# Patient Record
Sex: Female | Born: 1960
Health system: Southern US, Community
[De-identification: ages and names within clinical notes are randomized; demographics above are authoritative.]

## PROBLEM LIST (undated history)

## (undated) DIAGNOSIS — F419 Anxiety disorder, unspecified: Secondary | ICD-10-CM

## (undated) DIAGNOSIS — I2109 ST elevation (STEMI) myocardial infarction involving other coronary artery of anterior wall: Principal | ICD-10-CM

## (undated) DIAGNOSIS — F17200 Nicotine dependence, unspecified, uncomplicated: Secondary | ICD-10-CM

## (undated) DIAGNOSIS — E782 Mixed hyperlipidemia: Secondary | ICD-10-CM

## (undated) DIAGNOSIS — I255 Ischemic cardiomyopathy: Secondary | ICD-10-CM

## (undated) DIAGNOSIS — M1990 Unspecified osteoarthritis, unspecified site: Secondary | ICD-10-CM

---

## 2000-08-21 ENCOUNTER — Encounter (INDEPENDENT_AMBULATORY_CARE_PROVIDER_SITE_OTHER): Payer: Self-pay | Admitting: *Deleted

## 2000-08-21 LAB — CONVERTED CEMR LAB

## 2000-09-11 ENCOUNTER — Encounter: Admission: RE | Admit: 2000-09-11 | Discharge: 2000-09-11 | Payer: Self-pay | Admitting: Family Medicine

## 2000-10-17 ENCOUNTER — Encounter: Admission: RE | Admit: 2000-10-17 | Discharge: 2000-10-17 | Payer: Self-pay | Admitting: Family Medicine

## 2001-03-22 ENCOUNTER — Encounter: Payer: Self-pay | Admitting: Emergency Medicine

## 2001-03-22 ENCOUNTER — Emergency Department (HOSPITAL_COMMUNITY): Admission: EM | Admit: 2001-03-22 | Discharge: 2001-03-22 | Payer: Self-pay | Admitting: Emergency Medicine

## 2001-03-28 ENCOUNTER — Encounter: Admission: RE | Admit: 2001-03-28 | Discharge: 2001-03-28 | Payer: Self-pay | Admitting: Family Medicine

## 2001-04-18 ENCOUNTER — Encounter: Admission: RE | Admit: 2001-04-18 | Discharge: 2001-04-18 | Payer: Self-pay | Admitting: Family Medicine

## 2006-07-30 ENCOUNTER — Emergency Department (HOSPITAL_COMMUNITY): Admission: EM | Admit: 2006-07-30 | Discharge: 2006-07-30 | Payer: Self-pay | Admitting: Emergency Medicine

## 2007-01-19 ENCOUNTER — Encounter (INDEPENDENT_AMBULATORY_CARE_PROVIDER_SITE_OTHER): Payer: Self-pay | Admitting: *Deleted

## 2007-05-10 ENCOUNTER — Emergency Department (HOSPITAL_COMMUNITY): Admission: EM | Admit: 2007-05-10 | Discharge: 2007-05-10 | Payer: Self-pay | Admitting: Emergency Medicine

## 2007-05-15 ENCOUNTER — Ambulatory Visit (HOSPITAL_COMMUNITY): Admission: RE | Admit: 2007-05-15 | Discharge: 2007-05-15 | Payer: Self-pay | Admitting: Emergency Medicine

## 2010-02-09 ENCOUNTER — Emergency Department (HOSPITAL_COMMUNITY): Admission: EM | Admit: 2010-02-09 | Discharge: 2010-02-09 | Payer: Self-pay | Admitting: Emergency Medicine

## 2010-10-03 ENCOUNTER — Emergency Department (HOSPITAL_COMMUNITY): Admission: EM | Admit: 2010-10-03 | Discharge: 2010-10-03 | Payer: Self-pay | Admitting: Emergency Medicine

## 2010-10-21 ENCOUNTER — Emergency Department (HOSPITAL_COMMUNITY)
Admission: EM | Admit: 2010-10-21 | Discharge: 2010-10-21 | Payer: Self-pay | Source: Home / Self Care | Admitting: Emergency Medicine

## 2010-11-27 ENCOUNTER — Emergency Department (HOSPITAL_COMMUNITY)
Admission: EM | Admit: 2010-11-27 | Discharge: 2010-11-27 | Payer: Self-pay | Source: Home / Self Care | Admitting: Emergency Medicine

## 2010-12-06 LAB — RAPID STREP SCREEN (MED CTR MEBANE ONLY): Streptococcus, Group A Screen (Direct): POSITIVE — AB

## 2011-03-15 ENCOUNTER — Ambulatory Visit (INDEPENDENT_AMBULATORY_CARE_PROVIDER_SITE_OTHER): Payer: Self-pay

## 2011-03-15 ENCOUNTER — Inpatient Hospital Stay (INDEPENDENT_AMBULATORY_CARE_PROVIDER_SITE_OTHER)
Admission: RE | Admit: 2011-03-15 | Discharge: 2011-03-15 | Disposition: A | Discharge status: Home / Self Care | Payer: Self-pay | Source: Ambulatory Visit | Attending: Emergency Medicine | Admitting: Emergency Medicine

## 2011-03-15 DIAGNOSIS — M25469 Effusion, unspecified knee: Secondary | ICD-10-CM

## 2011-03-15 LAB — URIC ACID: Uric Acid, Serum: 3.8 mg/dL (ref 2.4–7.0)

## 2011-03-19 ENCOUNTER — Inpatient Hospital Stay (INDEPENDENT_AMBULATORY_CARE_PROVIDER_SITE_OTHER)
Admission: RE | Admit: 2011-03-19 | Discharge: 2011-03-19 | Disposition: A | Discharge status: Home / Self Care | Payer: Self-pay | Source: Ambulatory Visit | Attending: Emergency Medicine | Admitting: Emergency Medicine

## 2011-03-19 DIAGNOSIS — M25469 Effusion, unspecified knee: Secondary | ICD-10-CM

## 2011-04-05 NOTE — Procedures (Signed)
PRIMARY CARE PHYSICIAN:  Dr. Lynelle Doctor.   CLINICAL INFORMATION:  This patient is a 50 year old white female with a  single episode of seizure activity last Thursday, currently on etodolac,  gabapentin, sertraline, tramadol and trazodone.   TECHNICAL DESCRIPTION:  This EEG was recorded entirely during the awake  state.  The background activity shows high frequency alpha and low-  voltage fast beta activity symmetrically present without much effect on  the background activity with eye opening and eye closing maneuvers.  Photic stimulation was performed which produced a driving response in  the occipital head regions.  Hyperventilation testing was performed  without any abnormalities seen.  There was no evidence of any focal  asymmetry, stage II sleep or epileptiform activity present on this EEG.   IMPRESSION:  This is a normal EEG.  There is much low-voltage fast beta  activity suggestive of medication effect.  Should the possibility of  seizure activity be sought, then consideration of a sleep-deprived EEG  may be in order to look for evidence of epileptiform activity.           ______________________________  Genene Churn. Sandria Manly, M.D.     [facility]  D:  05/15/2007 15:28:47  T:  05/16/2007 08:47:20  Job #:  [id number]

## 2011-09-07 LAB — CBC
HCT: 32.9 — ABNORMAL LOW
Hemoglobin: 10.8 — ABNORMAL LOW
MCHC: 32.9
MCV: 77.9 — ABNORMAL LOW
Platelets: 229
RBC: 4.22
RDW: 15 — ABNORMAL HIGH
WBC: 6.4

## 2011-09-07 LAB — COMPREHENSIVE METABOLIC PANEL
ALT: 14
AST: 19
Albumin: 4.1
Alkaline Phosphatase: 158 — ABNORMAL HIGH
BUN: 5 — ABNORMAL LOW
CO2: 27
Calcium: 9.1
Chloride: 102
Creatinine, Ser: 0.72
GFR calc Af Amer: >60
GFR calc non Af Amer: >60
Glucose, Bld: 101 — ABNORMAL HIGH
Potassium: 3.4 — ABNORMAL LOW
Sodium: 137
Total Bilirubin: 0.7
Total Protein: 7.3

## 2011-09-07 LAB — DIFFERENTIAL
Basophils Absolute: 0
Basophils Relative: 1
Eosinophils Absolute: 0.1
Eosinophils Relative: 1
Lymphocytes Relative: 26
Lymphs Abs: 1.6
Monocytes Absolute: 0.5
Monocytes Relative: 8
Neutro Abs: 4.1
Neutrophils Relative %: 65

## 2011-09-07 LAB — RAPID URINE DRUG SCREEN, HOSP PERFORMED
Amphetamines: NOT DETECTED
Barbiturates: NOT DETECTED
Benzodiazepines: NOT DETECTED
Cocaine: NOT DETECTED
Opiates: NOT DETECTED
Tetrahydrocannabinol: NOT DETECTED

## 2011-09-07 LAB — MAGNESIUM: Magnesium: 1.8

## 2011-09-07 LAB — ACETAMINOPHEN LEVEL: Acetaminophen (Tylenol), Serum: <10 — ABNORMAL LOW

## 2011-09-07 LAB — ETHANOL: Alcohol, Ethyl (B): <5

## 2012-08-05 ENCOUNTER — Encounter (HOSPITAL_COMMUNITY): Payer: Self-pay | Admitting: Emergency Medicine

## 2012-08-05 ENCOUNTER — Emergency Department (INDEPENDENT_AMBULATORY_CARE_PROVIDER_SITE_OTHER)
Admission: EM | Admit: 2012-08-05 | Discharge: 2012-08-05 | Disposition: A | Discharge status: Home / Self Care | Payer: Self-pay | Source: Home / Self Care | Attending: Emergency Medicine | Admitting: Emergency Medicine

## 2012-08-05 DIAGNOSIS — M25569 Pain in unspecified knee: Secondary | ICD-10-CM

## 2012-08-05 MED ORDER — TRAMADOL HCL 50 MG PO TABS: 50.0000 mg | ORAL_TABLET | Freq: Four times a day (QID) | ORAL | Status: AC | PRN

## 2012-08-05 MED ORDER — KETOROLAC TROMETHAMINE 60 MG/2ML IM SOLN
INTRAMUSCULAR | Status: AC
  Filled 2012-08-05: qty 2

## 2012-08-05 MED ORDER — KETOROLAC TROMETHAMINE 60 MG/2ML IM SOLN
60.0000 mg | Freq: Once | INTRAMUSCULAR | Status: AC
  Administered 2012-08-05: 60 mg via INTRAMUSCULAR

## 2012-08-05 MED ORDER — MELOXICAM 7.5 MG PO TABS: 7.5000 mg | ORAL_TABLET | Freq: Every day | ORAL | Status: AC

## 2012-08-05 NOTE — ED Provider Notes (Signed)
Medical screening examination/treatment/procedure(s) were performed by non-physician practitioner and as supervising physician I was immediately available for consultation/collaboration.  Kamry Faraci   Neela Zecca, MD 08/05/12 1412 

## 2012-08-05 NOTE — ED Notes (Signed)
Pt c/o bilateral knee pain x several wks most pain is at night time, ? Some swelling. Pt has tried otc meds for pain with no relief.

## 2012-08-05 NOTE — ED Provider Notes (Signed)
History     CSN: [id number]  Arrival date & time 08/05/12  1204   None     Chief Complaint  Patient presents with  . Knee Pain    bilateral knee pain    (Consider location/radiation/quality/duration/timing/severity/associated sxs/prior treatment) The history is provided by the patient.  Complains of right sharp intermittent knee pain, known history of arthritis.  No known injury.  Has been taking OTC pain relievers with minimal relief.  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  Pain is worse at night, no limping.    History reviewed. No pertinent past medical history.  History reviewed. No pertinent past surgical history.  Family History  Problem Relation Age of Onset  . Cancer Other     History  Substance Use Topics  . Smoking status: Current Every Day Smoker -- 1.0 packs/day    Types: Cigarettes  . Smokeless tobacco: Not on file  . Alcohol Use: No    OB History    Grav Para Term Preterm Abortions TAB SAB Ect Mult Living                  Review of Systems  Constitutional: Negative.   Respiratory: Negative.   Cardiovascular: Negative.   Musculoskeletal: Positive for arthralgias.    Allergies  Review of patient's allergies indicates no known allergies.  Home Medications   Current Outpatient Rx  Name Route Sig Dispense Refill  . NEURONTIN PO Oral Take by mouth.    Marland Kitchen ZOLOFT PO Oral Take by mouth.    . MELOXICAM 7.5 MG PO TABS Oral Take 1 tablet (7.5 mg total) by mouth daily. 30 tablet 2  . TRAMADOL HCL 50 MG PO TABS Oral Take 1 tablet (50 mg total) by mouth every 6 (six) hours as needed for pain. 30 tablet 1    BP 118/63  Pulse 70  Temp 98.1 F (36.7 C) (Oral)  Resp 18  SpO2 99%  Physical Exam  Nursing note and vitals reviewed. Constitutional: She is oriented to person, place, and time. Vital signs are normal. She appears well-developed and well-nourished. She is active and cooperative.  HENT:  Head:  Normocephalic.  Eyes: Conjunctivae normal are normal. Pupils are equal, round, and reactive to light. No scleral icterus.  Neck: Trachea normal. Neck supple.  Cardiovascular: Normal rate, regular rhythm and normal heart sounds.   Pulmonary/Chest: Effort normal and breath sounds normal.  Musculoskeletal:       Right knee: Normal.       Left knee: Normal.       Crepitus palpated in right knee, no erythema or obvious swelling.  Neurological: She is alert and oriented to person, place, and time. No cranial nerve deficit or sensory deficit.  Skin: Skin is warm and dry.  Psychiatric: She has a normal mood and affect. Her speech is normal and behavior is normal. Judgment and thought content normal. Cognition and memory are normal.    ED Course  Procedures (including critical care time)  Labs Reviewed - No data to display No results found.   1. Knee pain       MDM  Mobic daily.  Ultram for pain that is worse at nigh.  Knee sleeve provided.  Follow up with ortho for further evaluation and intervention.          Johnsie Kindred, NP 08/05/12 1334

## 2013-05-22 ENCOUNTER — Encounter (HOSPITAL_COMMUNITY): Payer: Self-pay

## 2013-05-22 ENCOUNTER — Emergency Department (HOSPITAL_COMMUNITY)
Admission: EM | Admit: 2013-05-22 | Discharge: 2013-05-22 | Disposition: A | Discharge status: Home / Self Care | Payer: Self-pay | Attending: Emergency Medicine | Admitting: Emergency Medicine

## 2013-05-22 DIAGNOSIS — K029 Dental caries, unspecified: Secondary | ICD-10-CM | POA: Insufficient documentation

## 2013-05-22 DIAGNOSIS — K047 Periapical abscess without sinus: Secondary | ICD-10-CM | POA: Insufficient documentation

## 2013-05-22 DIAGNOSIS — Z79899 Other long term (current) drug therapy: Secondary | ICD-10-CM | POA: Insufficient documentation

## 2013-05-22 DIAGNOSIS — F17200 Nicotine dependence, unspecified, uncomplicated: Secondary | ICD-10-CM | POA: Insufficient documentation

## 2013-05-22 DIAGNOSIS — R220 Localized swelling, mass and lump, head: Secondary | ICD-10-CM | POA: Insufficient documentation

## 2013-05-22 MED ORDER — HYDROCODONE-ACETAMINOPHEN 5-325 MG PO TABS: 1.0000 | ORAL_TABLET | ORAL | Status: DC | PRN

## 2013-05-22 MED ORDER — CLINDAMYCIN HCL 150 MG PO CAPS: 150.0000 mg | ORAL_CAPSULE | Freq: Four times a day (QID) | ORAL | Status: DC

## 2013-05-22 NOTE — ED Provider Notes (Signed)
Medical screening examination/treatment/procedure(s) were performed by non-physician practitioner and as supervising physician I was immediately available for consultation/collaboration.  Ethelda Chick, MD 05/22/13 1023

## 2013-05-22 NOTE — Progress Notes (Signed)
[facility] has seen patient and provided her with a list of primary care resources, as well as, a oc application. ° °

## 2013-05-22 NOTE — ED Notes (Signed)
Pt c/o rt lower toothache with facial swelling x5days

## 2013-05-22 NOTE — ED Provider Notes (Signed)
   History    CSN: [id number] Arrival date & time 05/22/13  4098  First MD Initiated Contact with Patient 05/22/13 1011     Chief Complaint  Patient presents with  . Dental Pain   (Consider location/radiation/quality/duration/timing/severity/associated sxs/prior Treatment) HPI  52 year old female presents with dental pain.  Report gradual onset of throbbing pain to R lower tooth with associate facial swelling x 5 days.  Pain is non radiating, 8/10, worsening with chewing and minimally improved with OTC pain med.  No fever, headache, neck pain, sore throat, rash, or recent trauma.  Does not have a dentist.  Not allergic to any medication.   History reviewed. No pertinent past medical history. History reviewed. No pertinent past surgical history. Family History  Problem Relation Age of Onset  . Cancer Other    History  Substance Use Topics  . Smoking status: Current Every Day Smoker -- 1.00 packs/day    Types: Cigarettes  . Smokeless tobacco: Not on file  . Alcohol Use: No   OB History   Grav Para Term Preterm Abortions TAB SAB Ect Mult Living                 Review of Systems  Constitutional: Negative for fever.  HENT: Positive for dental problem.   Skin: Negative for rash.  Neurological: Negative for headaches.     Allergies  Review of patient's allergies indicates no known allergies.  Home Medications   Current Outpatient Rx  Name  Route  Sig  Dispense  Refill  . Gabapentin (NEURONTIN PO)   Oral   Take by mouth.         . meloxicam (MOBIC) 7.5 MG tablet   Oral   Take 1 tablet (7.5 mg total) by mouth daily.   30 tablet   2   . Sertraline HCl (ZOLOFT PO)   Oral   Take by mouth.          BP 120/74  Pulse 68  Temp(Src) 98.6 F (37 C) (Oral)  Resp 15  SpO2 96% Physical Exam  Nursing note and vitals reviewed. Constitutional: She appears well-developed and well-nourished.  HENT:  Head: Atraumatic.  Tenderness and associate gum swelling to R lower  canine with significant dental decay.  Facial swelling noted.  No trismus.  Small abscess not amenable for drainage at this time.    Eyes: Conjunctivae are normal.  Neck: Neck supple.  Musculoskeletal: Normal range of motion.  Lymphadenopathy:    She has no cervical adenopathy.  Neurological: She is alert.  Skin: Skin is warm. No rash noted.  Psychiatric: She has a normal mood and affect.    ED Course  Procedures (including critical care time)  10:17 AM Dental pain associate with periapical abscess and facial involvement.  Will d/c with pain meds and abx.     Labs Reviewed - No data to display No results found. 1. Periapical abscess with facial involvement     MDM  BP 120/74  Pulse 68  Temp(Src) 98.6 F (37 C) (Oral)  Resp 15  SpO2 96%   Fayrene Helper, PA-C 05/22/13 1021

## 2013-09-04 ENCOUNTER — Emergency Department (HOSPITAL_COMMUNITY): Payer: No Typology Code available for payment source

## 2013-09-04 ENCOUNTER — Inpatient Hospital Stay (HOSPITAL_COMMUNITY)
Admission: EM | Admit: 2013-09-04 | Discharge: 2013-09-06 | DRG: 282 | Disposition: A | Discharge status: Home / Self Care | Payer: Self-pay | Attending: Interventional Cardiology | Admitting: Interventional Cardiology

## 2013-09-04 ENCOUNTER — Encounter (HOSPITAL_COMMUNITY): Payer: Self-pay | Admitting: Emergency Medicine

## 2013-09-04 DIAGNOSIS — I255 Ischemic cardiomyopathy: Secondary | ICD-10-CM

## 2013-09-04 DIAGNOSIS — Z79899 Other long term (current) drug therapy: Secondary | ICD-10-CM

## 2013-09-04 DIAGNOSIS — I213 ST elevation (STEMI) myocardial infarction of unspecified site: Secondary | ICD-10-CM

## 2013-09-04 DIAGNOSIS — I519 Heart disease, unspecified: Secondary | ICD-10-CM | POA: Diagnosis present

## 2013-09-04 DIAGNOSIS — E782 Mixed hyperlipidemia: Secondary | ICD-10-CM

## 2013-09-04 DIAGNOSIS — F17200 Nicotine dependence, unspecified, uncomplicated: Secondary | ICD-10-CM | POA: Diagnosis present

## 2013-09-04 DIAGNOSIS — I2109 ST elevation (STEMI) myocardial infarction involving other coronary artery of anterior wall: Principal | ICD-10-CM | POA: Diagnosis present

## 2013-09-04 DIAGNOSIS — M129 Arthropathy, unspecified: Secondary | ICD-10-CM | POA: Diagnosis present

## 2013-09-04 DIAGNOSIS — F411 Generalized anxiety disorder: Secondary | ICD-10-CM | POA: Diagnosis present

## 2013-09-04 DIAGNOSIS — Z7982 Long term (current) use of aspirin: Secondary | ICD-10-CM

## 2013-09-04 DIAGNOSIS — E785 Hyperlipidemia, unspecified: Secondary | ICD-10-CM | POA: Diagnosis present

## 2013-09-04 HISTORY — DX: Unspecified osteoarthritis, unspecified site: M19.90

## 2013-09-04 HISTORY — DX: Ischemic cardiomyopathy: I25.5

## 2013-09-04 HISTORY — DX: Anxiety disorder, unspecified: F41.9

## 2013-09-04 HISTORY — DX: ST elevation (STEMI) myocardial infarction involving other coronary artery of anterior wall: I21.09

## 2013-09-04 HISTORY — DX: Mixed hyperlipidemia: E78.2

## 2013-09-04 HISTORY — DX: Nicotine dependence, unspecified, uncomplicated: F17.200

## 2013-09-04 LAB — POCT I-STAT TROPONIN I: Troponin i, poc: 0.62 ng/mL (ref 0.00–0.08)

## 2013-09-04 LAB — CBC
HCT: 39.4 % (ref 36.0–46.0)
Hemoglobin: 14.2 g/dL (ref 12.0–15.0)
MCH: 31.7 pg (ref 26.0–34.0)
MCHC: 36 g/dL (ref 30.0–36.0)
MCV: 87.9 fL (ref 78.0–100.0)
Platelets: 139 10*3/uL — ABNORMAL LOW (ref 150–400)
RBC: 4.48 MIL/uL (ref 3.87–5.11)
RDW: 13.2 % (ref 11.5–15.5)
WBC: 6.1 10*3/uL (ref 4.0–10.5)

## 2013-09-04 MED ORDER — FENTANYL CITRATE 0.05 MG/ML IJ SOLN
INTRAMUSCULAR | Status: AC
  Administered 2013-09-04: 100 ug
  Filled 2013-09-04: qty 2

## 2013-09-04 MED ORDER — ASPIRIN 81 MG PO CHEW
324.0000 mg | CHEWABLE_TABLET | Freq: Once | ORAL | Status: AC
  Administered 2013-09-04: 324 mg via ORAL
  Filled 2013-09-04: qty 4

## 2013-09-04 MED ORDER — NITROGLYCERIN IN D5W 200-5 MCG/ML-% IV SOLN
2.0000 ug/min | Freq: Once | INTRAVENOUS | Status: AC
  Administered 2013-09-04: 10 ug/min via INTRAVENOUS
  Filled 2013-09-04: qty 250

## 2013-09-04 MED ORDER — HEPARIN SODIUM (PORCINE) 5000 UNIT/ML IJ SOLN
60.0000 [IU]/kg | Freq: Once | INTRAMUSCULAR | Status: AC
  Administered 2013-09-04: 3950 [IU] via INTRAVENOUS
  Filled 2013-09-04: qty 0.79

## 2013-09-04 MED ORDER — HEPARIN (PORCINE) IN NACL 100-0.45 UNIT/ML-% IJ SOLN
12.0000 [IU]/kg/h | INTRAMUSCULAR | Status: DC
  Administered 2013-09-04: 12 [IU]/kg/h via INTRAVENOUS
  Filled 2013-09-04: qty 250

## 2013-09-04 NOTE — ED Notes (Signed)
Pt complains of a pressure type left sided cheat pain on and off all day, no other symptoms

## 2013-09-04 NOTE — ED Provider Notes (Signed)
CSN: [id number]     Arrival date & time 09/04/13  2314 History   First MD Initiated Contact with Patient 09/04/13 2326     Chief Complaint  Patient presents with  . Chest Pain   (Consider location/radiation/quality/duration/timing/severity/associated sxs/prior Treatment) Patient is a 52 y.o. female presenting with chest pain. The history is provided by the patient.  Chest Pain  patient complains of substernal chest pain which began today and has been episodic lasting from 5-20 minutes with associated dyspnea diaphoresis. No prior history of same. Denies any history of CAD. Patient has been more continuous which is why she presents now in his characterizes 5 of 10. Denies any recent fever or chills. No cough or congestion. Denies any syncope or near-syncope. No treatment used prior to arrival and symptoms do seem to be exertional.  Past Medical History  Diagnosis Date  . Anxiety    History reviewed. No pertinent past surgical history. Family History  Problem Relation Age of Onset  . Cancer Other    History  Substance Use Topics  . Smoking status: Current Every Day Smoker -- 1.00 packs/day    Types: Cigarettes  . Smokeless tobacco: Not on file  . Alcohol Use: No   OB History   Grav Para Term Preterm Abortions TAB SAB Ect Mult Living                 Review of Systems  Cardiovascular: Positive for chest pain.  All other systems reviewed and are negative.    Allergies  Review of patient's allergies indicates no known allergies.  Home Medications   Current Outpatient Rx  Name  Route  Sig  Dispense  Refill  . clindamycin (CLEOCIN) 150 MG capsule   Oral   Take 1 capsule (150 mg total) by mouth every 6 (six) hours.   28 capsule   0   . Gabapentin (NEURONTIN PO)   Oral   Take by mouth.         Marland Kitchen HYDROcodone-acetaminophen (NORCO/VICODIN) 5-325 MG per tablet   Oral   Take 1 tablet by mouth every 4 (four) hours as needed for pain.   10 tablet   0   . Sertraline HCl  (ZOLOFT PO)   Oral   Take by mouth.          BP 150/89  Pulse 64  Temp(Src) 98.4 F (36.9 C) (Oral)  Resp 20  Ht 5\' 4"  (1.626 m)  Wt 145 lb (65.772 kg)  BMI 24.88 kg/m2  SpO2 98% Physical Exam  Nursing note and vitals reviewed. Constitutional: She is oriented to person, place, and time. She appears well-developed and well-nourished.  Non-toxic appearance. No distress.  HENT:  Head: Normocephalic and atraumatic.  Eyes: Conjunctivae, EOM and lids are normal. Pupils are equal, round, and reactive to light.  Neck: Normal range of motion. Neck supple. No tracheal deviation present. No mass present.  Cardiovascular: Normal rate, regular rhythm and normal heart sounds.  Exam reveals no gallop.   No murmur heard. Pulmonary/Chest: Effort normal and breath sounds normal. No stridor. No respiratory distress. She has no decreased breath sounds. She has no wheezes. She has no rhonchi. She has no rales.  Abdominal: Soft. Normal appearance and bowel sounds are normal. She exhibits no distension. There is no tenderness. There is no rebound and no CVA tenderness.  Musculoskeletal: Normal range of motion. She exhibits no edema and no tenderness.  Neurological: She is alert and oriented to person, place, and time. She  has normal strength. No cranial nerve deficit or sensory deficit. GCS eye subscore is 4. GCS verbal subscore is 5. GCS motor subscore is 6.  Skin: Skin is warm and dry. No abrasion and no rash noted.  Psychiatric: She has a normal mood and affect. Her speech is normal and behavior is normal.    ED Course  Procedures (including critical care time) Labs Review Labs Reviewed  CBC   Imaging Review No results found.  EKG Interpretation     Ventricular Rate:  66 PR Interval:  116 QRS Duration: 82 QT Interval:  440 QTC Calculation: 461 R Axis:   36 Text Interpretation:  Sinus rhythm Borderline short PR interval Left atrial enlargement Anteroseptal infarct, age indeterminate  Lateral leads are also involved Baseline wander in lead(s) V4            MDM  No diagnosis found.  Patient's EKG consistent with acute anterior wall MI. Code STEMI activated. She was given aspirin on arrival here. Nitroglycerin and heparin ordered. Patient to be transferred  to [facility] to the catheterization lab   Toy Baker, MD 09/04/13 [id number]

## 2013-09-05 ENCOUNTER — Encounter (HOSPITAL_COMMUNITY)
Admission: EM | Disposition: A | Discharge status: Home / Self Care | Payer: Self-pay | Source: Home / Self Care | Attending: Interventional Cardiology

## 2013-09-05 ENCOUNTER — Other Ambulatory Visit: Payer: Self-pay

## 2013-09-05 ENCOUNTER — Encounter (HOSPITAL_COMMUNITY): Payer: Self-pay | Admitting: Emergency Medicine

## 2013-09-05 ENCOUNTER — Ambulatory Visit (HOSPITAL_COMMUNITY): Admit: 2013-09-05 | Payer: No Typology Code available for payment source | Admitting: Interventional Cardiology

## 2013-09-05 DIAGNOSIS — F17200 Nicotine dependence, unspecified, uncomplicated: Secondary | ICD-10-CM | POA: Insufficient documentation

## 2013-09-05 DIAGNOSIS — I2109 ST elevation (STEMI) myocardial infarction involving other coronary artery of anterior wall: Principal | ICD-10-CM

## 2013-09-05 DIAGNOSIS — I2510 Atherosclerotic heart disease of native coronary artery without angina pectoris: Secondary | ICD-10-CM

## 2013-09-05 HISTORY — PX: PERCUTANEOUS CORONARY STENT INTERVENTION (PCI-S): SHX5485

## 2013-09-05 HISTORY — PX: LEFT HEART CATHETERIZATION WITH CORONARY ANGIOGRAM: SHX5451

## 2013-09-05 LAB — CBC
HCT: 35.9 % — ABNORMAL LOW (ref 36.0–46.0)
MCH: 31 pg (ref 26.0–34.0)
MCV: 88.2 fL (ref 78.0–100.0)
RDW: 13.5 % (ref 11.5–15.5)
WBC: 7.5 10*3/uL (ref 4.0–10.5)

## 2013-09-05 LAB — CK TOTAL AND CKMB (NOT AT ARMC)
CK, MB: 126.6 ng/mL (ref 0.3–4.0)
CK, MB: 104.1 ng/mL (ref 0.3–4.0)
Total CK: 1769 U/L — ABNORMAL HIGH (ref 7–177)
Total CK: 1606 U/L — ABNORMAL HIGH (ref 7–177)

## 2013-09-05 LAB — BASIC METABOLIC PANEL
BUN: 4 mg/dL — ABNORMAL LOW (ref 6–23)
CO2: 26 mEq/L (ref 19–32)
Calcium: 8.6 mg/dL (ref 8.4–10.5)
Chloride: 103 mEq/L (ref 96–112)
Creatinine, Ser: 0.61 mg/dL (ref 0.50–1.10)
GFR calc Af Amer: >90 mL/min (ref >90)
GFR calc non Af Amer: >90 mL/min (ref >90)

## 2013-09-05 LAB — LIPID PANEL
Cholesterol: 209 mg/dL — ABNORMAL HIGH (ref 0–200)
HDL: 32 mg/dL — ABNORMAL LOW (ref >39)
Total CHOL/HDL Ratio: 6.5 RATIO
Triglycerides: 116 mg/dL (ref <150)

## 2013-09-05 LAB — POCT I-STAT, CHEM 8
Calcium, Ion: 1.15 mmol/L (ref 1.12–1.23)
Glucose, Bld: 118 mg/dL — ABNORMAL HIGH (ref 70–99)
HCT: 37 % (ref 36.0–46.0)
Hemoglobin: 12.6 g/dL (ref 12.0–15.0)
TCO2: 23 mmol/L (ref 0–100)

## 2013-09-05 LAB — POCT ACTIVATED CLOTTING TIME: Activated Clotting Time: 304 seconds

## 2013-09-05 SURGERY — LEFT HEART CATHETERIZATION WITH CORONARY ANGIOGRAM
Anesthesia: Choice | Laterality: Bilateral

## 2013-09-05 MED ORDER — HEPARIN (PORCINE) IN NACL 2-0.9 UNIT/ML-% IJ SOLN
INTRAMUSCULAR | Status: AC
  Filled 2013-09-05: qty 1000

## 2013-09-05 MED ORDER — MIDAZOLAM HCL 2 MG/2ML IJ SOLN
INTRAMUSCULAR | Status: AC
  Filled 2013-09-05: qty 2

## 2013-09-05 MED ORDER — HYDROCODONE-ACETAMINOPHEN 5-325 MG PO TABS: 1.0000 | ORAL_TABLET | ORAL | Status: DC | PRN

## 2013-09-05 MED ORDER — LIDOCAINE HCL (PF) 1 % IJ SOLN
INTRAMUSCULAR | Status: AC
  Filled 2013-09-05: qty 30

## 2013-09-05 MED ORDER — NITROGLYCERIN 0.2 MG/ML ON CALL CATH LAB
INTRAVENOUS | Status: AC
  Filled 2013-09-05: qty 1

## 2013-09-05 MED ORDER — METOPROLOL TARTRATE 25 MG PO TABS
25.0000 mg | ORAL_TABLET | Freq: Two times a day (BID) | ORAL | Status: DC
  Administered 2013-09-05 – 2013-09-06 (×3): 25 mg via ORAL
  Filled 2013-09-05 (×4): qty 1

## 2013-09-05 MED ORDER — ALPRAZOLAM 0.5 MG PO TABS: 0.5000 mg | ORAL_TABLET | Freq: Four times a day (QID) | ORAL | Status: DC | PRN

## 2013-09-05 MED ORDER — BIVALIRUDIN 250 MG IV SOLR
INTRAVENOUS | Status: AC
  Filled 2013-09-05: qty 250

## 2013-09-05 MED ORDER — TICAGRELOR 90 MG PO TABS
90.0000 mg | ORAL_TABLET | Freq: Two times a day (BID) | ORAL | Status: DC
  Administered 2013-09-05 – 2013-09-06 (×3): 90 mg via ORAL
  Filled 2013-09-05 (×5): qty 1

## 2013-09-05 MED ORDER — FENTANYL CITRATE 0.05 MG/ML IJ SOLN
INTRAMUSCULAR | Status: AC
  Filled 2013-09-05: qty 2

## 2013-09-05 MED ORDER — SODIUM CHLORIDE 0.9 % IV SOLN: 1.0000 mL/kg/h | INTRAVENOUS | Status: AC

## 2013-09-05 MED ORDER — TICAGRELOR 90 MG PO TABS
ORAL_TABLET | ORAL | Status: AC
  Filled 2013-09-05: qty 2

## 2013-09-05 MED ORDER — ZOLPIDEM TARTRATE 5 MG PO TABS
5.0000 mg | ORAL_TABLET | Freq: Every evening | ORAL | Status: DC | PRN
  Administered 2013-09-05: 5 mg via ORAL
  Filled 2013-09-05: qty 1

## 2013-09-05 MED ORDER — GABAPENTIN 100 MG PO CAPS
200.0000 mg | ORAL_CAPSULE | Freq: Two times a day (BID) | ORAL | Status: DC
  Administered 2013-09-05 – 2013-09-06 (×3): 200 mg via ORAL
  Filled 2013-09-05 (×4): qty 2

## 2013-09-05 MED ORDER — SERTRALINE HCL 25 MG PO TABS
25.0000 mg | ORAL_TABLET | Freq: Every day | ORAL | Status: DC
  Administered 2013-09-05 – 2013-09-06 (×2): 25 mg via ORAL
  Filled 2013-09-05 (×2): qty 1

## 2013-09-05 MED ORDER — ATORVASTATIN CALCIUM 40 MG PO TABS
40.0000 mg | ORAL_TABLET | Freq: Every day | ORAL | Status: DC
  Administered 2013-09-05: 40 mg via ORAL
  Filled 2013-09-05 (×2): qty 1

## 2013-09-05 MED ORDER — ONDANSETRON HCL 4 MG/2ML IJ SOLN: 4.0000 mg | Freq: Four times a day (QID) | INTRAMUSCULAR | Status: DC | PRN

## 2013-09-05 MED ORDER — ASPIRIN 81 MG PO CHEW
81.0000 mg | CHEWABLE_TABLET | Freq: Every day | ORAL | Status: DC
  Administered 2013-09-05 – 2013-09-06 (×2): 81 mg via ORAL
  Filled 2013-09-05 (×2): qty 1

## 2013-09-05 MED ORDER — ALPRAZOLAM 0.5 MG PO TABS
0.5000 mg | ORAL_TABLET | Freq: Two times a day (BID) | ORAL | Status: DC | PRN
  Administered 2013-09-05 (×2): 0.5 mg via ORAL
  Filled 2013-09-05 (×2): qty 1

## 2013-09-05 MED ORDER — ACETAMINOPHEN 325 MG PO TABS: 650.0000 mg | ORAL_TABLET | ORAL | Status: DC | PRN

## 2013-09-05 NOTE — CV Procedure (Signed)
       PROCEDURE:  Left heart catheterization with selective coronary angiography, left ventriculogram.  INDICATIONS:  Anterior STEMI  The risks, benefits, and details of the procedure were explained to the patient.  The patient verbalized understanding and wanted to proceed.  Informed written consent was obtained.  PROCEDURE TECHNIQUE:  After Xylocaine anesthesia a 46F slender sheath was placed in the right radial artery with a single anterior needle wall stick.   Right coronary angiography was done using a Judkins R4 guide catheter.  Left coronary angiography was done using a CLS 3.0 guide catheter.  Left ventriculography was done using a pigtail catheter.  A TR band was used for hemostasis.   CONTRAST:  Total of 115 cc.  COMPLICATIONS:  None.    HEMODYNAMICS:  Aortic pressure was 134/81; LV pressure was 140/12; LVEDP 20.  There was no gradient between the left ventricle and aorta.    ANGIOGRAPHIC DATA:   The left main coronary artery is widely patent.  The left anterior descending artery is a large vessel proximally. There is a large first diagonal which extends across the lateral wall. Just after the diagonal, there is a 25% lesion. There is a normal segment followed by a 99% mid LAD lesion with TIMI 2 flow.  The distal LAD is small there is an early bifurcation of the PDA and posterolateral artery. Both vessels are large with only minimal atherosclerosis.  The left circumflex artery is a large vessel. There is mild atherosclerosis proximally. The OM one is small. There is a large OM 2 which appears widely patent. The remainder of the circumflex is small and patent.  The right coronary artery is a large dominant vessel with minimal atherosclerosis.    LEFT VENTRICULOGRAM:  Left ventricular angiogram was done in the 30 RAO projection and revealed moderately decreased left ventricular function with MID to distal anterior and apical hypokinesis  with an estimated ejection fraction of 30%.   LVEDP was 20  MmHg.  PCI NARRATIVE: A CLS 3.0 guiding catheter was used to engage the left main.  Angiomax was used for anticoagulation. An ACT was used to check that the Angiomax was therapeutic. A pro-water wire was placed across the area disease in the LAD. A 2.5 x 12 balloon was used to predilate the area. A 2.5 x 20 progress drug-eluting stent was then used to treat the area disease. The stent was post dilated with a 3.0 x 12 noncompliant balloon inflated to 16 atmospheres. There was an excellent angiographic result. A dose of intra-coronary nitroglycerin was administered as well. TIMI-3 flow was restored.   IMPRESSIONS:  1. Normal left main coronary artery. 2.  99% mid  left anterior descending artery  lesion which was the culprit for today's presentation. This was successfully treated with a 2.5 x 20 progress drug-eluting stent, postdilated to greater than 3 mm in diameter. There is a large diagonal  Branch which is patent. 3.  Widely patent  left circumflex artery and its branches. 4. Widely patent  right coronary artery. 5.  Moderately reduced  left ventricular systolic function.  LVEDP 20  mmHg.  Ejection fraction 30%.  RECOMMENDATION:  Continue dual antiplatelet therapy for at least a year. I stressed the importance of staying on her medications to prevent stent thrombosis. SHe needs to stop smoking. She needs aggressive secondary prevention. If her blood pressure will tolerate it, will start ACE inhibitor given her LV dysfunction.  She will also need a beta blocker as well.

## 2013-09-05 NOTE — Progress Notes (Signed)
ANTICOAGULATION CONSULT NOTE - Initial Consult  Pharmacy Consult for Heparin Indication: chest pain/ACS  No Known Allergies  Patient Measurements: Height: 5\' 4"  (162.6 cm) Weight: 145 lb (65.772 kg) IBW/kg (Calculated) : 54.7 Heparin Dosing Weight:   Vital Signs: Temp: 98.4 F (36.9 C) (10/15 2319) Temp src: Oral (10/15 2319) BP: 150/89 mmHg (10/15 2319) Pulse Rate: 64 (10/15 2319)  Labs:  Recent Labs  09/04/13 2335  HGB 14.2  HCT 39.4  PLT 139*    Estimated Creatinine Clearance: 75.9 ml/min (by C-G formula based on Cr of 0.72).   Medical History: Past Medical History  Diagnosis Date  . Anxiety     Medications:  Infusions:  . heparin 12 Units/kg/hr (09/04/13 2345)    Assessment: Patient with STEMI.  Heparin 5000 units vial for 60 units/kg dose and drip sent to ED.  Goal of Therapy:  Heparin level 0.3-0.7 units/ml Monitor platelets by anticoagulation protocol: Yes   Plan:  Heparin drip at 800 units/hr after ordered bolus [facility] notified and will follow up with labs as needed  Aleene Davidson Crowford 09/05/2013,12:05 AM

## 2013-09-05 NOTE — Progress Notes (Signed)
SUBJECTIVE:  No further CP.  No problems with wrist.  No arrhythmia  OBJECTIVE:   Vitals:   Filed Vitals:   09/05/13 1200 09/05/13 1213 09/05/13 1300 09/05/13 1400  BP: 148/94  118/86 136/94  Pulse: 78  70 66  Temp:  98.1 F (36.7 C)    TempSrc:  Oral    Resp: 19  23 12   Height:      Weight:      SpO2: 100%  97% 100%   I&O's:   Intake/Output Summary (Last 24 hours) at 09/05/13 1512 Last data filed at 09/05/13 1100  Gross per 24 hour  Intake    685 ml  Output   1950 ml  Net  -1265 ml   TELEMETRY: Reviewed telemetry pt in NSR:     PHYSICAL EXAM General: Well developed, well nourished, in no acute distress Head: Eyes PERRLA, No xanthomas.   Normal cephalic and atramatic  Lungs:   Clear bilaterally to auscultation and percussion. Heart:  *HRRR S1 S2             No carotid bruit. No JVD.  No abdominal bruits. No femoral bruits. Abdomen: Bowel sounds are positive, abdomen soft and non-tender Msk:  Back normal, normal gait. Normal strength and tone for age. Extremities:  No  edema.  2+ right radial Neuro: Alert and oriented X 3. Psych:  Good affect, responds appropriately   LABS: Basic Metabolic Panel:  Recent Labs  16/10/96 0500  NA 140  K 4.0  CL 103  CO2 26  GLUCOSE 98  BUN 4*  CREATININE 0.61  CALCIUM 8.6   Liver Function Tests: No results found for this basename: AST, ALT, ALKPHOS, BILITOT, PROT, ALBUMIN,  in the last 72 hours No results found for this basename: LIPASE, AMYLASE,  in the last 72 hours CBC:  Recent Labs  09/04/13 2335 09/05/13 0500  WBC 6.1 7.5  HGB 14.2 12.6  HCT 39.4 35.9*  MCV 87.9 88.2  PLT 139* 139*   Cardiac Enzymes:  Recent Labs  09/05/13 0520 09/05/13 1050  CKTOTAL 1769* 1606*  CKMB 126.6* 104.1*   BNP: No components found with this basename: POCBNP,  D-Dimer: No results found for this basename: DDIMER,  in the last 72 hours Hemoglobin A1C: No results found for this basename: HGBA1C,  in the last 72  hours Fasting Lipid Panel:  Recent Labs  09/05/13 0500  CHOL 209*  HDL 32*  LDLCALC 154*  TRIG 116  CHOLHDL 6.5   Thyroid Function Tests: No results found for this basename: TSH, T4TOTAL, FREET3, T3FREE, THYROIDAB,  in the last 72 hours Anemia Panel: No results found for this basename: VITAMINB12, FOLATE, FERRITIN, TIBC, IRON, RETICCTPCT,  in the last 72 hours Coag Panel:   No results found for this basename: INR, PROTIME    RADIOLOGY: Dg Chest Portable 1 View  09/05/2013   CLINICAL DATA:  Chest pain  EXAM: PORTABLE CHEST - 1 VIEW  COMPARISON:  None.  FINDINGS: The heart size and mediastinal contours are within normal limits for technique. Both lungs are clear. The visualized skeletal structures are unremarkable.  IMPRESSION: No active disease.   Electronically Signed   By: Tiburcio Pea M.D.   On: 09/05/2013 00:37      ASSESSMENT: Late presenting anterior MI, tobacco abuse  PLAN:  Watch in CCU.  Start low dose ACE-I given LV dysfunction.   beta blocker started as well.  BMet in AM.  Statin started.  Check lipids.  Consider  transfer to tele tomorrow.  Cardiac rehab. Stop smoking.   Corky Crafts., MD  09/05/2013  3:12 PM

## 2013-09-05 NOTE — Progress Notes (Signed)
CARDIAC REHAB PHASE I   PRE:  Rate/Rhythm: 69 SR  BP:  Supine: 136/94  Sitting:   Standing:    SaO2: 98%2L  MODE:  Ambulation: 350 ft   POST:  Rate/Rhythm: 94SR  BP:  Supine:   Sitting: 141/88  Standing:    SaO2: 95%RA 1400-1440 Pt seems anxious even though she denies it. Stopped in middle of walk to take deep breath and during education she leaned forward to take deep breath. Sats good on RA. To recliner after walk. Pt appeared to be getting anxious with ed so brief ed done. Discussed smoking cessation and handout given. Encouraged pt to call [email or web address] as needed for coaching. Stated she was quitting cold [place]. Discussed stent/brililnta. Pt does not have insurance and will need case manager to see re brilinta. Reviewed MI restrictions. Gave MI booklet and stent card. Will follow up tomorrow. Walked 350 ft with asst x 1 with no CP.   Luetta Nutting, RN BSN  09/05/2013 2:37 PM

## 2013-09-05 NOTE — ED Notes (Signed)
This a 52 yo that presented with midsternal chest pain was brought in by ems, on nitro @10mcq /min and heparin at 800 units/hr, pt given iv fentinly for chest pain of + 7 with gradual relief, by sent to cath lab

## 2013-09-05 NOTE — ED Notes (Signed)
Pt arrived at [facility] ER Trauma Room A at 2350

## 2013-09-05 NOTE — ED Provider Notes (Signed)
Seen on transfer awaiting cath lab   AO3 NCAT EOMI RRR CTAB NABS, soft  Medications  heparin ADULT infusion 100 units/mL (25000 units/250 mL) (12 Units/kg/hr  65.8 kg Intravenous New Bag/Given 09/04/13 2345)  aspirin chewable tablet 324 mg (324 mg Oral Given 09/04/13 2332)  heparin injection 3,950 Units (3,950 Units Intravenous Given 09/04/13 2335)  nitroGLYCERIN 0.2 mg/mL in dextrose 5 % infusion (10 mcg/min Intravenous New Bag/Given 09/04/13 2344)  fentaNYL (SUBLIMAZE) 0.05 MG/ML injection (100 mcg  Given 09/04/13 2359)    Admit to cath lab   Avyana Puffenbarger K Ciearra Rufo-Rasch, MD 09/05/13 1610

## 2013-09-05 NOTE — ED Notes (Signed)
This is a 52yo patient that presented with midsternal chest discomfort that gradually worsening pt first seen at [facility] long and transfer to , pt at this time has 10 mcq/min nitro and 800 units/hr of herparin,pt given iv fentanly for chest pain, gradually became better, pt sent to cath lab

## 2013-09-05 NOTE — Progress Notes (Signed)
Code Stemi patient located at [facility] ED and coming to [facility]. Chaplain paged to come to ED. No family members present. Waited to see if family members were going to arrive. Chaplain asked [employer] operational officer to call if an when family arrives.   09/04/13 2333  Clinical Encounter Type  Visited With Patient not available;Health care provider  Visit Type Initial;Code  Referral From Nurse

## 2013-09-05 NOTE — ED Notes (Signed)
Pt received into trama a pt transfer from [facility], pt on a nitro gtt at 58mcq/min and heparin at 800 units/hr, pt received asa also on route, pt received fenatly in trama room and taken to cath suite

## 2013-09-05 NOTE — Progress Notes (Signed)
Dr. Anne Fu was called twice today due to patient's anxiety, and increase in blood pressure/heart rate, SOB during these "attacks" as the family states.  PRN anxiety medication was ordered and increased due to her condition, patient is resting comfortably, with decreased SOB, VS: Blood pressure 142/92, pulse 72, temperature 97.9 F (36.6 C), temperature source Oral, resp. rate 20, height 5\' 4"  (1.626 m), weight 65.8 kg (145 lb 1 oz), SpO2 98.00%.

## 2013-09-05 NOTE — H&P (Signed)
Cardiology History and Physical  No primary provider on file.  History of Present Illness (and review of medical records): Michele Black is a 52 y.o. female who presents for evaluation of chest pain.  Patient denies any prior known hx of CAD or MI.  She woke up with pain this am.  Pain was left side and rated 9/10.  Pain persisted throughout the day.  No associated symptoms.  She presented to [facility] as pain remained severe tonight.  Her ecg was concerning for ST elevation and thus she was transferred to [facility] as a Code STEMI.  She was given ASA, Nitro, Heparin bolus.  She still is having 6/10 pain upon arrival to [facility] with persistent ecg changes.  Previous diagnostic testing for coronary artery disease includes: none. Previous history of cardiac disease includes None. Coronary artery disease risk factors include: sedentary lifestyle and smoking/ tobacco exposure. Patient denies history of coronary artery disease and previous M.I..  Review of Systems Pertinent items are noted in HPI.  There are no active problems to display for this patient.  Past Medical History  Diagnosis Date  . Anxiety   . Arthritis     History reviewed. No pertinent past surgical history.  Prescriptions prior to admission  Medication Sig Dispense Refill  . clindamycin (CLEOCIN) 150 MG capsule Take 1 capsule (150 mg total) by mouth every 6 (six) hours.  28 capsule  0  . Gabapentin (NEURONTIN PO) Take by mouth.      Marland Kitchen HYDROcodone-acetaminophen (NORCO/VICODIN) 5-325 MG per tablet Take 1 tablet by mouth every 4 (four) hours as needed for pain.  10 tablet  0  . Sertraline HCl (ZOLOFT PO) Take by mouth.       No Known Allergies  History  Substance Use Topics  . Smoking status: Former Smoker -- 1.00 packs/day    Types: Cigarettes    Quit date: 09/05/2013  . Smokeless tobacco: Not on file  . Alcohol Use: Not on file    Family History  Problem Relation Age of Onset  . Cancer Other      Objective:  Patient  Vitals for the past 8 hrs:  BP Temp Temp src Pulse Resp SpO2 Height Weight  09/05/13 0003 148/84 mmHg 98.1 F (36.7 C) Oral 84 - 98 % 5\' 4"  (1.626 m) 67.132 kg (148 lb)  09/04/13 2319 150/89 mmHg 98.4 F (36.9 C) Oral 64 20 98 % 5\' 4"  (1.626 m) 65.772 kg (145 lb)   General appearance: alert, cooperative, appears stated age and no distress Head: Normocephalic, without obvious abnormality, atraumatic Eyes: conjunctivae/corneas clear. PERRL, EOM's intact. Fundi benign. Neck: no carotid bruit, no JVD and supple, symmetrical, trachea midline Lungs: clear to auscultation bilaterally Chest wall: no tenderness Heart: regular rate and rhythm, S1, S2 normal, no murmur, click, rub or gallop Abdomen: soft, non-tender; bowel sounds normal; no masses,  no organomegaly Extremities: extremities normal, atraumatic, no cyanosis or edema Pulses: 2+ and symmetric Neurologic: Grossly normal  Results for orders placed during the hospital encounter of 09/04/13 (from the past 48 hour(s))  CBC     Status: Abnormal   Collection Time    09/04/13 11:35 PM      Result Value Range   WBC 6.1  4.0 - 10.5 K/uL   RBC 4.48  3.87 - 5.11 MIL/uL   Hemoglobin 14.2  12.0 - 15.0 g/dL   HCT 95.2  84.1 - 32.4 %   MCV 87.9  78.0 - 100.0 fL   MCH  31.7  26.0 - 34.0 pg   MCHC 36.0  30.0 - 36.0 g/dL   RDW 09.8  11.9 - 14.7 %   Platelets 139 (*) 150 - 400 K/uL  POCT I-STAT TROPONIN I     Status: Abnormal   Collection Time    09/04/13 11:45 PM      Result Value Range   Troponin i, poc 0.62 (*) 0.00 - 0.08 ng/mL   Comment NOTIFIED PHYSICIAN     Comment 3            Comment: Due to the release kinetics of cTnI,     a negative result within the first hours     of the onset of symptoms does not rule out     myocardial infarction with certainty.     If myocardial infarction is still suspected,     repeat the test at appropriate intervals.   No results found.  ECG:  Hr 66 ST elevation in anteroseptal leads, ST depression  in inferior leads, baseline wander in V4, no prior to compare  Assessment/Plan: Anteroseptal MI Tobacco abuse  Plan for urgent cardiac catheterization. No aboslute contraindications to DAPT Admit to CCU post procedure Continuous monitoring on Telemetry. Repeat ekg on admit, prn chest pain or arrythmia Trend cardiac biomarkers, check lipids, hgba1c, tsh Tobacco cessation Further medical management pending procedure results

## 2013-09-06 ENCOUNTER — Encounter (HOSPITAL_COMMUNITY): Payer: Self-pay | Admitting: Interventional Cardiology

## 2013-09-06 DIAGNOSIS — I2589 Other forms of chronic ischemic heart disease: Secondary | ICD-10-CM

## 2013-09-06 DIAGNOSIS — F17200 Nicotine dependence, unspecified, uncomplicated: Secondary | ICD-10-CM

## 2013-09-06 DIAGNOSIS — I255 Ischemic cardiomyopathy: Secondary | ICD-10-CM | POA: Insufficient documentation

## 2013-09-06 DIAGNOSIS — E782 Mixed hyperlipidemia: Secondary | ICD-10-CM | POA: Insufficient documentation

## 2013-09-06 MED ORDER — TICAGRELOR 90 MG PO TABS: 90.0000 mg | ORAL_TABLET | Freq: Two times a day (BID) | ORAL | Status: DC

## 2013-09-06 MED ORDER — ATORVASTATIN CALCIUM 40 MG PO TABS: 40.0000 mg | ORAL_TABLET | Freq: Every day | ORAL | Status: DC

## 2013-09-06 MED ORDER — LISINOPRIL 5 MG PO TABS
5.0000 mg | ORAL_TABLET | Freq: Every day | ORAL | Status: DC
  Administered 2013-09-06: 5 mg via ORAL
  Filled 2013-09-06: qty 1

## 2013-09-06 MED ORDER — METOPROLOL TARTRATE 25 MG PO TABS: 25.0000 mg | ORAL_TABLET | Freq: Two times a day (BID) | ORAL | Status: DC

## 2013-09-06 MED ORDER — NITROGLYCERIN 0.4 MG SL SUBL: 0.4000 mg | SUBLINGUAL_TABLET | SUBLINGUAL | Status: DC | PRN

## 2013-09-06 MED ORDER — FUROSEMIDE 20 MG PO TABS: 20.0000 mg | ORAL_TABLET | Freq: Two times a day (BID) | ORAL | Status: DC | PRN

## 2013-09-06 MED ORDER — ASPIRIN 81 MG PO CHEW: 81.0000 mg | CHEWABLE_TABLET | Freq: Every day | ORAL | Status: DC

## 2013-09-06 MED ORDER — FUROSEMIDE 40 MG PO TABS: 20.0000 mg | ORAL_TABLET | Freq: Two times a day (BID) | ORAL | Status: DC | PRN

## 2013-09-06 MED ORDER — LISINOPRIL 5 MG PO TABS: 5.0000 mg | ORAL_TABLET | Freq: Every day | ORAL | Status: DC

## 2013-09-06 MED FILL — Sodium Chloride IV Soln 0.9%: INTRAVENOUS | Qty: 50 | Status: AC

## 2013-09-06 NOTE — Progress Notes (Signed)
SUBJECTIVE:  No further CP.  No problems with wrist.  No arrhythmia. Walked with rehab without difficulty.  OBJECTIVE:   Vitals:   Filed Vitals:   09/06/13 0500 09/06/13 0600 09/06/13 0700 09/06/13 0721  BP: 127/98 127/84 107/69 137/82  Pulse:    78  Temp:    98.6 F (37 C)  TempSrc:    Oral  Resp: 18 23 22 18   Height:      Weight:      SpO2: 96% 98% 95% 93%   I&O's:    Intake/Output Summary (Last 24 hours) at 09/06/13 0848 Last data filed at 09/06/13 0730  Gross per 24 hour  Intake    390 ml  Output    350 ml  Net     40 ml   TELEMETRY: Reviewed telemetry pt in NSR:     PHYSICAL EXAM General: Well developed, well nourished, in no acute distress Head: Eyes PERRLA, No xanthomas.   Normal cephalic and atramatic  Lungs:   Clear bilaterally to auscultation and percussion. Heart:  *HRRR S1 S2             No carotid bruit. No JVD.  No abdominal bruits. No femoral bruits. Abdomen: Bowel sounds are positive, abdomen soft and non-tender Msk:  Back normal, normal gait. Normal strength and tone for age. Extremities:  No  edema.  2+ right radial Neuro: Alert and oriented X 3. Psych:  Good affect, responds appropriately   LABS: Basic Metabolic Panel:  Recent Labs  40/98/11 0045 09/05/13 0500  NA 139 140  K 3.0* 4.0  CL 100 103  CO2  --  26  GLUCOSE 118* 98  BUN 3* 4*  CREATININE 0.60 0.61  CALCIUM  --  8.6   Liver Function Tests: No results found for this basename: AST, ALT, ALKPHOS, BILITOT, PROT, ALBUMIN,  in the last 72 hours No results found for this basename: LIPASE, AMYLASE,  in the last 72 hours CBC:  Recent Labs  09/04/13 2335 09/05/13 0045 09/05/13 0500  WBC 6.1  --  7.5  HGB 14.2 12.6 12.6  HCT 39.4 37.0 35.9*  MCV 87.9  --  88.2  PLT 139*  --  139*   Cardiac Enzymes:  Recent Labs  09/05/13 0520 09/05/13 1050  CKTOTAL 1769* 1606*  CKMB 126.6* 104.1*   BNP: No components found with this basename: POCBNP,  D-Dimer: No results found for  this basename: DDIMER,  in the last 72 hours Hemoglobin A1C: No results found for this basename: HGBA1C,  in the last 72 hours Fasting Lipid Panel:  Recent Labs  09/05/13 0500  CHOL 209*  HDL 32*  LDLCALC 154*  TRIG 116  CHOLHDL 6.5   Thyroid Function Tests: No results found for this basename: TSH, T4TOTAL, FREET3, T3FREE, THYROIDAB,  in the last 72 hours Anemia Panel: No results found for this basename: VITAMINB12, FOLATE, FERRITIN, TIBC, IRON, RETICCTPCT,  in the last 72 hours Coag Panel:   No results found for this basename: INR,  PROTIME    RADIOLOGY: Dg Chest Portable 1 View  09/05/2013   CLINICAL DATA:  Chest pain  EXAM: PORTABLE CHEST - 1 VIEW  COMPARISON:  None.  FINDINGS: The heart size and mediastinal contours are within normal limits for technique. Both lungs are clear. The visualized skeletal structures are unremarkable.  IMPRESSION: No active disease.   Electronically Signed   By: Tiburcio Pea M.D.   On: 09/05/2013 00:37      ASSESSMENT: Late  presenting anterior MI, tobacco abuse  PLAN:   Start low dose ACE-I given LV dysfunction.   beta blocker started as well.  BMet.  Statin started.  Check lipids.  Consider d/c in afternoon.  Cardiac rehab. Stop smoking.   If finiancial issues present for Brilinta, could switch to Plavix after one month.   No work until November 3.  Corky Crafts., MD  09/06/2013  8:48 AM

## 2013-09-06 NOTE — Progress Notes (Signed)
CARDIAC REHAB PHASE I   PRE:  Rate/Rhythm: 72 SR  BP:  Supine:   Sitting: 122/81  Standing:    SaO2:   MODE:  Ambulation: 700 ft   POST:  Rate/Rhythm: 102 ST  BP:  Supine:   Sitting: 132/82  Standing:    SaO2:   Patient tolerated ambulation well.  For discharge today to home.  2 sons present for education, one was very receptive and supportive, but the other slept in a chair without any input.  Patient more at ease today, receptive to education, and able to repeat instructions.  All education completed.  RE: entry into phase II cardiac rehab, patient does not have transportation or insurance.  Financial assistance forms and phase II cardiac rehab forms left with patient.  [id number] Cindra Eves RN, BSN 09/06/2013 [phone]

## 2013-09-06 NOTE — Care Management Note (Signed)
    Page 1 of 1   09/06/2013     4:00:22 PM   CARE MANAGEMENT NOTE 09/06/2013  Patient:  Michele Black, Michele Black   Account Number:  [id number]  Date Initiated:  09/06/2013  Documentation initiated by:  Donelle Baba  Subjective/Objective Assessment:   adm with dx of STEMI     DC Planning Services  CM consult  Medication Assistance      Discharge Disposition:  HOME/SELF CARE  Per UR Regulation:  Reviewed for med. necessity/level of care/duration of stay  Comments:  09/06/13 1420 [name] RN MSN BSN CCM Pt to d/c on Brilinta.  Provided card for free 30-day supply and determined that her pharmacy has Brilinta in stock.  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  Pt will copy her tax return and deliver it to CM for faxing with application.

## 2013-09-06 NOTE — Discharge Summary (Signed)
Patient ID: Michele Black MRN: [id number] DOB/AGE: 1961-03-12 52 y.o.  Admit date: 09/04/2013 Discharge date: 09/06/2013  Primary Discharge Diagnosis acute anterior wall myocardial infarction Secondary Discharge Diagnosis left ventricular dysfunction, hyperlipidemia, anxiety  Significant Diagnostic Studies: angiography: Cardiac catheterization revealing 99% LAD lesion with TIMI 2 flow. This was successfully stented with a 2.5 x 20 promise drug-eluting stent, post dilated to greater than 3 mm in diameter.  Consults: None  Hospital Course: 52 year old and who had been having chest discomfort for most of the day. She came to the emergency room and had an ECG showing ST elevation anteriorly. She was brought emergently to the Cath Lab and underwent revascularization as noted above. She had no bleeding problems from her wrist. Despite her LV dysfunction, she did not have any congestive heart failure symptoms. We did give a prescription for Lasix when necessary just in the event she developed fluid overload, swelling or shortness of breath. She had no further chest pain. She walked with cardiac rehabilitation without difficulties. She did have an elevated LDL. She was started on a statin. She was started on ACE inhibitor and beta blocker for her LV dysfunction as well. She will require a BMet at her followup in one week.   Discharge Exam: Blood pressure 113/90, pulse 78, temperature 98.6 F (37 C), temperature source Oral, resp. rate 15, height 5\' 4"  (1.626 m), weight 145 lb 1 oz (65.8 kg), SpO2 97.00%.   Gorman/AT RRR, S1, S2 Clear to auscultation bilaterally Soft nontender 2+ right radial pulse, mild bruising No lower extremity edema  Labs:   Lab Results  Component Value Date   WBC 7.5 09/05/2013   HGB 12.6 09/05/2013   HCT 35.9* 09/05/2013   MCV 88.2 09/05/2013   PLT 139* 09/05/2013     Recent Labs Lab 09/05/13 0500  NA 140  K 4.0  CL 103  CO2 26  BUN 4*  CREATININE 0.61   CALCIUM 8.6  GLUCOSE 98   Lab Results  Component Value Date   CKTOTAL 1606* 09/05/2013   CKMB 104.1* 09/05/2013    Lab Results  Component Value Date   CHOL 209* 09/05/2013   Lab Results  Component Value Date   HDL 32* 09/05/2013   Lab Results  Component Value Date   LDLCALC 154* 09/05/2013   Lab Results  Component Value Date   TRIG 116 09/05/2013   Lab Results  Component Value Date   CHOLHDL 6.5 09/05/2013   No results found for this basename: LDLDIRECT       EKG: Normal sinus rhythm, anterolateral T wave inversion  FOLLOW UP PLANS AND APPOINTMENTS      Future Appointments Provider Department Dept Phone   09/24/2013 10:20 AM [facility] [place] Office [phone]   09/24/2013 10:30 AM Everette Rank, MD [facility] [phone]       Medication List         ALPRAZolam 1 MG tablet  Commonly known as:  XANAX  Take 1 mg by mouth daily.     aspirin 81 MG chewable tablet  Chew 1 tablet (81 mg total) by mouth daily.     atorvastatin 40 MG tablet  Commonly known as:  LIPITOR  Take 1 tablet (40 mg total) by mouth daily at 6 PM.     furosemide 20 MG tablet  Commonly known as:  LASIX  Take 1 tablet (20 mg total) by mouth 2 (two) times daily as needed for fluid or edema (SHortness of  breath).     gabapentin 100 MG capsule  Commonly known as:  NEURONTIN  Take 100 mg by mouth 3 (three) times daily.     lisinopril 5 MG tablet  Commonly known as:  PRINIVIL,ZESTRIL  Take 1 tablet (5 mg total) by mouth daily.     metoprolol tartrate 25 MG tablet  Commonly known as:  LOPRESSOR  Take 1 tablet (25 mg total) by mouth 2 (two) times daily.     nitroGLYCERIN 0.4 MG SL tablet  Commonly known as:  NITROSTAT  Place 1 tablet (0.4 mg total) under the tongue every 5 (five) minutes as needed for chest pain.     sertraline 100 MG tablet  Commonly known as:  ZOLOFT  Take 100 mg by mouth daily.     Ticagrelor 90 MG Tabs tablet   Commonly known as:  BRILINTA  Take 1 tablet (90 mg total) by mouth 2 (two) times daily.     traZODone 100 MG tablet  Commonly known as:  DESYREL  Take 100 mg by mouth at bedtime.       Follow-up Information   Follow up with Corky Crafts., MD.   Specialty:  Cardiology   Contact information:   [address] [place] [address] [phone]       BRING ALL MEDICATIONS WITH YOU TO FOLLOW UP APPOINTMENTS  Time spent with patient to include physician time: 35 minutes going over medication changes Signed: VARANASI,JAYADEEP S. 09/06/2013, 1:29 PM

## 2013-09-22 ENCOUNTER — Encounter: Payer: Self-pay | Admitting: Obstetrics and Gynecology

## 2013-09-23 ENCOUNTER — Encounter: Payer: Self-pay | Admitting: Interventional Cardiology

## 2013-09-24 ENCOUNTER — Inpatient Hospital Stay: Payer: Self-pay

## 2013-09-24 ENCOUNTER — Ambulatory Visit (INDEPENDENT_AMBULATORY_CARE_PROVIDER_SITE_OTHER): Payer: Self-pay | Admitting: Interventional Cardiology

## 2013-09-24 ENCOUNTER — Other Ambulatory Visit: Payer: No Typology Code available for payment source

## 2013-09-24 ENCOUNTER — Ambulatory Visit: Payer: Self-pay

## 2013-09-24 ENCOUNTER — Encounter: Payer: Self-pay | Admitting: Interventional Cardiology

## 2013-09-24 VITALS — BP 110/56 | HR 52 | Ht 64.0 in | Wt 140.0 lb

## 2013-09-24 DIAGNOSIS — I2589 Other forms of chronic ischemic heart disease: Secondary | ICD-10-CM

## 2013-09-24 DIAGNOSIS — F17200 Nicotine dependence, unspecified, uncomplicated: Secondary | ICD-10-CM

## 2013-09-24 DIAGNOSIS — E782 Mixed hyperlipidemia: Secondary | ICD-10-CM

## 2013-09-24 DIAGNOSIS — I2109 ST elevation (STEMI) myocardial infarction involving other coronary artery of anterior wall: Secondary | ICD-10-CM

## 2013-09-24 DIAGNOSIS — I255 Ischemic cardiomyopathy: Secondary | ICD-10-CM

## 2013-09-24 LAB — BASIC METABOLIC PANEL
Calcium: 9.4 mg/dL (ref 8.4–10.5)
GFR: 73.28 mL/min (ref >60.00)
Sodium: 135 mEq/L (ref 135–145)

## 2013-09-24 MED ORDER — CLOPIDOGREL BISULFATE 75 MG PO TABS: 75.0000 mg | ORAL_TABLET | Freq: Every day | ORAL | Status: DC

## 2013-09-24 NOTE — Patient Instructions (Addendum)
Your physician recommends that you return for lab work today for [employer].  Your physician has requested that you have an echocardiogram in 1 month. Echocardiography is a painless test that uses sound waves to create images of your heart. It provides your doctor with information about the size and shape of your heart and how well your heart's chambers and valves are working. This procedure takes approximately one hour. There are no restrictions for this procedure.  Continue Brilinta 90mg  1 tab by mouth twice a day. Let us know if you do not get approved through [employer] for pt assistance. If you do not get approved then Dr. Eldridge Dace would like for you to start Plavix 75mg  daily. Rx has been placed on hold at pharmacy in case you run out of Brilinta. You will need to be on either Brilinta or Plavix at all times.   Your physician recommends that you schedule a follow-up appointment in: 3 months with Dr. Eldridge Dace.

## 2013-09-24 NOTE — Progress Notes (Signed)
Patient ID: Michele Black, female   DOB: June 21, 1961, 52 y.o.   MRN: [id number]    [address] [place], [place]  [address] Phone: [phone] Fax:  [phone]  Date:  09/24/2013   ID:  Michele Black, DOB 01-07-1961, MRN [id number]  PCP:  No primary provider on file.      History of Present Illness: Michele Black is a 52 y.o. female who had an anterior MI.  She presented late but had an LAD stent paced.  No Cp, but some SHOB, mostly with exertion.  Occasional lightheadedness with standing or bending down and standing up. No symptoms like what she had with her heart attack. She is trying to stop smoking. She has had some anxiety issues since her heart attack. This was also a problem before. No syncope, sweating, nausea or vomiting. No lower extremity swelling. No trouble lying flat.  She does mention that she does not have insurance and is concerned about cost of her medications.   Wt Readings from Last 3 Encounters:  09/24/13 140 lb (63.504 kg)  09/05/13 145 lb 1 oz (65.8 kg)  09/05/13 145 lb 1 oz (65.8 kg)     Past Medical History  Diagnosis Date  . Anxiety   . Arthritis   . Acute myocardial infarction of other anterior wall, initial episode of care   . Tobacco use disorder   . Mixed hyperlipidemia   . Ischemic cardiomyopathy     Current Outpatient Prescriptions  Medication Sig Dispense Refill  . ARIPiprazole (ABILIFY) 5 MG tablet Take 5 mg by mouth daily.      Marland Kitchen aspirin 81 MG chewable tablet Chew 1 tablet (81 mg total) by mouth daily.      Marland Kitchen atorvastatin (LIPITOR) 40 MG tablet Take 1 tablet (40 mg total) by mouth daily at 6 PM.  30 tablet  11  . furosemide (LASIX) 20 MG tablet Take 1 tablet (20 mg total) by mouth 2 (two) times daily as needed for fluid or edema (SHortness of breath).  60 tablet  6  . gabapentin (NEURONTIN) 100 MG capsule Take 100 mg by mouth 3 (three) times daily.      Marland Kitchen lisinopril (PRINIVIL,ZESTRIL) 5 MG tablet Take 1 tablet (5 mg total) by mouth  daily.  30 tablet  11  . metoprolol tartrate (LOPRESSOR) 25 MG tablet Take 1 tablet (25 mg total) by mouth 2 (two) times daily.  60 tablet  11  . nitroGLYCERIN (NITROSTAT) 0.4 MG SL tablet Place 1 tablet (0.4 mg total) under the tongue every 5 (five) minutes as needed for chest pain.  25 tablet  12  . sertraline (ZOLOFT) 100 MG tablet Take 100 mg by mouth daily.      . Ticagrelor (BRILINTA) 90 MG TABS tablet Take 1 tablet (90 mg total) by mouth 2 (two) times daily.  60 tablet  0  . traZODone (DESYREL) 100 MG tablet Take 100 mg by mouth at bedtime.       No current facility-administered medications for this visit.    Allergies:   No Known Allergies  Social History:  The patient  reports that she quit smoking about 2 weeks ago. Her smoking use included Cigarettes. She smoked 1.00 pack per day. She does not have any smokeless tobacco history on file. She reports that she does not drink alcohol or use illicit drugs.   Family History:  The patient's family history includes Throat cancer in her mother.   ROS:  Please  see the history of present illness.  No nausea, vomiting.  No fevers, chills.  No focal weakness.  No dysuria.    All other systems reviewed and negative.   PHYSICAL EXAM: VS:  BP 110/56  Pulse 52  Ht 5\' 4"  (1.626 m)  Wt 140 lb (63.504 kg)  BMI 24.02 kg/m2  SpO2 98% Well nourished, well developed, in no acute distress HEENT: normal Neck: no JVD, no carotid bruits Cardiac:  normal S1, S2; RRR;  Lungs:  clear to auscultation bilaterally, no wheezing, rhonchi or rales Abd: soft, nontender, no hepatomegaly Ext: no edema Skin: warm and dry Neuro:   no focal abnormalities noted      ASSESSMENT AND PLAN:  1. CAD/anterior MI/cardiomyopathy: No angina.  There is likely some LV dysfunction.  This may explain SHOB.  Will increase furosemide to 40 mg daily if Bmet today is ok.  SHe does not have much of an effect from the 20 mg daily.  Ejection fraction was 30% at the time of the  catheterization. She likely has some residual systolic dysfunction. 2. She does not have prescription drug coverage. Therefore Brilinta will be prohibitively expensive.  She is trying to see if she can get this medication free from the drug company. She has sent her tax return in. Hopefully, we'll find out in a few weeks if she will qualify for this program.  If she cannot get Brilinta, we will switch her to clopidogrel 75 mg by mouth daily. Will call this prescription in so that she has something in the event she runs out of her Brilinta. 3. Smoking: She has cut back but not stopped completely.  She has not bought any cigarettes.   4. Check echo in one month to evaluate LV function.    Signed, Fredric Mare, MD, [facility] 09/24/2013 10:29 AM

## 2013-09-25 ENCOUNTER — Telehealth: Payer: Self-pay | Admitting: Cardiology

## 2013-09-25 DIAGNOSIS — Z79899 Other long term (current) drug therapy: Secondary | ICD-10-CM

## 2013-09-25 MED ORDER — POTASSIUM CHLORIDE CRYS ER 20 MEQ PO TBCR: 40.0000 meq | EXTENDED_RELEASE_TABLET | Freq: Every day | ORAL | Status: DC

## 2013-09-25 NOTE — Telephone Encounter (Signed)
Potassium sent into pharmacy, bmet scheduled for 10/03/13.

## 2013-10-03 ENCOUNTER — Other Ambulatory Visit (INDEPENDENT_AMBULATORY_CARE_PROVIDER_SITE_OTHER): Payer: Self-pay

## 2013-10-03 DIAGNOSIS — Z79899 Other long term (current) drug therapy: Secondary | ICD-10-CM

## 2013-10-03 LAB — BASIC METABOLIC PANEL
Chloride: 98 mEq/L (ref 96–112)
Potassium: 3.8 mEq/L (ref 3.5–5.1)
Sodium: 134 mEq/L — ABNORMAL LOW (ref 135–145)

## 2013-10-09 ENCOUNTER — Ambulatory Visit: Payer: No Typology Code available for payment source | Attending: Internal Medicine | Admitting: Internal Medicine

## 2013-10-09 ENCOUNTER — Encounter: Payer: Self-pay | Admitting: Internal Medicine

## 2013-10-09 ENCOUNTER — Ambulatory Visit: Payer: Self-pay

## 2013-10-09 MED ORDER — CLOPIDOGREL BISULFATE 75 MG PO TABS: 75.0000 mg | ORAL_TABLET | Freq: Every day | ORAL | Status: DC

## 2013-10-09 MED ORDER — METOPROLOL TARTRATE 25 MG PO TABS: 25.0000 mg | ORAL_TABLET | Freq: Two times a day (BID) | ORAL | Status: DC

## 2013-10-09 MED ORDER — LISINOPRIL 5 MG PO TABS: 5.0000 mg | ORAL_TABLET | Freq: Every day | ORAL | Status: DC

## 2013-10-09 MED ORDER — NITROGLYCERIN 0.4 MG SL SUBL: 0.4000 mg | SUBLINGUAL_TABLET | SUBLINGUAL | Status: DC | PRN

## 2013-10-09 MED ORDER — FUROSEMIDE 20 MG PO TABS: 20.0000 mg | ORAL_TABLET | Freq: Two times a day (BID) | ORAL | Status: DC | PRN

## 2013-10-09 MED ORDER — ATORVASTATIN CALCIUM 40 MG PO TABS: 40.0000 mg | ORAL_TABLET | Freq: Every day | ORAL | Status: DC

## 2013-10-09 MED ORDER — TICAGRELOR 90 MG PO TABS: 90.0000 mg | ORAL_TABLET | Freq: Two times a day (BID) | ORAL | Status: DC

## 2013-10-09 MED ORDER — ASPIRIN 81 MG PO CHEW: 81.0000 mg | CHEWABLE_TABLET | Freq: Every day | ORAL | Status: DC

## 2013-10-09 NOTE — Progress Notes (Unsigned)
Patient ID: Michele Black, female   DOB: 12/06/1960, 52 y.o.   MRN: [id number]  CC:  HPI:  Michele Black is a 52 y.o. female who had an anterior MI. She presented late but had an LAD stent paced. No Cp, but some SHOB, mostly with exertion. Occasional lightheadedness with standing or bending down and standing up. No symptoms like what she had with her heart attack. She is trying to stop smoking. She has had some anxiety issues since her heart attack. This was also a problem before. No syncope, sweating, nausea or vomiting. No lower extremity swelling. No trouble lying flat.   The patient has not taken her brillanta, for 7 days She is expecting to get a supply in the mail in 5 days She has also run out off all other medications, at least 5 or 5 days now   No Known Allergies Past Medical History  Diagnosis Date  . Anxiety   . Arthritis   . Acute myocardial infarction of other anterior wall, initial episode of care   . Tobacco use disorder   . Mixed hyperlipidemia   . Ischemic cardiomyopathy    Current Outpatient Prescriptions on File Prior to Visit  Medication Sig Dispense Refill  . ARIPiprazole (ABILIFY) 5 MG tablet Take 5 mg by mouth daily.      Marland Kitchen gabapentin (NEURONTIN) 100 MG capsule Take 100 mg by mouth 3 (three) times daily.      . potassium chloride SA (KLOR-CON M20) 20 MEQ tablet Take 2 tablets (40 mEq total) by mouth daily.  90 tablet  1  . sertraline (ZOLOFT) 100 MG tablet Take 100 mg by mouth daily.      . traZODone (DESYREL) 100 MG tablet Take 100 mg by mouth at bedtime.       No current facility-administered medications on file prior to visit.   Family History  Problem Relation Age of Onset  . Throat cancer Mother    History   Social History  . Marital Status: Divorced    Spouse Name: N/A    Number of Children: N/A  . Years of Education: N/A   Occupational History  . Not on file.   Social History Main Topics  . Smoking status: Former Smoker -- 1.00 packs/day   Types: Cigarettes    Quit date: 09/05/2013  . Smokeless tobacco: Not on file  . Alcohol Use: No  . Drug Use: No  . Sexual Activity: Not on file   Other Topics Concern  . Not on file   Social History Narrative  . No narrative on file    Review of Systems  Constitutional: Negative for fever, chills, diaphoresis, activity change, appetite change and fatigue.  HENT: Negative for ear pain, nosebleeds, congestion, facial swelling, rhinorrhea, neck pain, neck stiffness and ear discharge.   Eyes: Negative for pain, discharge, redness, itching and visual disturbance.  Respiratory: Negative for cough, choking, chest tightness, shortness of breath, wheezing and stridor.   Cardiovascular: Negative for chest pain, palpitations and leg swelling.  Gastrointestinal: Negative for abdominal distention.  Genitourinary: Negative for dysuria, urgency, frequency, hematuria, flank pain, decreased urine volume, difficulty urinating and dyspareunia.  Musculoskeletal: Negative for back pain, joint swelling, arthralgias and gait problem.  Neurological: Negative for dizziness, tremors, seizures, syncope, facial asymmetry, speech difficulty, weakness, light-headedness, numbness and headaches.  Hematological: Negative for adenopathy. Does not bruise/bleed easily.  Psychiatric/Behavioral: Negative for hallucinations, behavioral problems, confusion, dysphoric mood, decreased concentration and agitation.    Objective:   Filed Vitals:  10/09/13 1541  BP: 121/74  Pulse: 64  Temp: 97.8 F (36.6 C)    Physical Exam  Constitutional: Appears well-developed and well-nourished. No distress.  HENT: Normocephalic. External right and left ear normal. Oropharynx is clear and moist.  Eyes: Conjunctivae and EOM are normal. PERRLA, no scleral icterus.  Neck: Normal ROM. Neck supple. No JVD. No tracheal deviation. No thyromegaly.  CVS: RRR, S1/S2 +, no murmurs, no gallops, no carotid bruit.  Pulmonary: Effort and breath  sounds normal, no stridor, rhonchi, wheezes, rales.  Abdominal: Soft. BS +,  no distension, tenderness, rebound or guarding.  Musculoskeletal: Normal range of motion. No edema and no tenderness.  Lymphadenopathy: No lymphadenopathy noted, cervical, inguinal. Neuro: Alert. Normal reflexes, muscle tone coordination. No cranial nerve deficit. Skin: Skin is warm and dry. No rash noted. Not diaphoretic. No erythema. No pallor.  Psychiatric: Normal mood and affect. Behavior, judgment, thought content normal.   Lab Results  Component Value Date   WBC 7.5 09/05/2013   HGB 12.6 09/05/2013   HCT 35.9* 09/05/2013   MCV 88.2 09/05/2013   PLT 139* 09/05/2013   Lab Results  Component Value Date   CREATININE 0.9 10/03/2013   BUN 7 10/03/2013   NA 134* 10/03/2013   K 3.8 10/03/2013   CL 98 10/03/2013   CO2 31 10/03/2013    No results found for this basename: HGBA1C   Lipid Panel     Component Value Date/Time   CHOL 209* 09/05/2013 0500   TRIG 116 09/05/2013 0500   HDL 32* 09/05/2013 0500   CHOLHDL 6.5 09/05/2013 0500   VLDL 23 09/05/2013 0500   LDLCALC 154* 09/05/2013 0500       Assessment and plan:   Patient Active Problem List   Diagnosis Date Noted  . Mixed hyperlipidemia   . Ischemic cardiomyopathy   . Acute myocardial infarction of other anterior wall, initial episode of care   . Tobacco use disorder      Recent anterior MI with cardiomyopathy EF of 30% Concern about not taking either Plavix or Brillinta, Is still taking her aspirin She has probably run out off metoprolol and lisinopril and Lasix Explained the risk of sudden cardiac death , another MI if she does not take her Brillinta or plavix All these medications [facility] She has Artist appointment today I have notified RN and financial counselor that the medications have been sent the [facility] and she should be given at least a few days supply  that she can have them refilled Follow up in one month    The patient was given clear instructions to go to ER or return to medical center if symptoms don't improve, worsen or new problems develop. The patient verbalized understanding. The patient was told to call to get any lab results if not heard anything in the next week.

## 2013-10-09 NOTE — Progress Notes (Unsigned)
Pt is here on a referral to our client. Pt recently had a heart attack; blood pressure reading is 121/74 today. Pt also wants to receive the orange card.

## 2013-10-10 ENCOUNTER — Telehealth: Payer: Self-pay | Admitting: Interventional Cardiology

## 2013-10-10 NOTE — Telephone Encounter (Signed)
New problem   Pt has been taking BRILINTA 90 mg  Pt was given [employer] [facility]ar gave her  CLOPIDOGREL 75 mg   Pt has questions about this please give her a call.

## 2013-10-10 NOTE — Telephone Encounter (Signed)
Spoke with pt and she ran out of Brilinta 90mg  on Saturday. Pt spoke with the pt asst program and she did get approved and she will receive her supply sometime within the next 5-7 days. Since pt has already pick up plavix 75 mg she will take this until she receives the brilinta in the mail. Per Riki Rusk this is okay to do. Pt verbalized understanding. FYI to Dr. Eldridge Dace.

## 2013-10-14 ENCOUNTER — Ambulatory Visit: Payer: Self-pay

## 2013-10-21 ENCOUNTER — Ambulatory Visit: Payer: Self-pay | Admitting: Internal Medicine

## 2013-10-23 ENCOUNTER — Ambulatory Visit: Payer: Self-pay

## 2013-10-24 ENCOUNTER — Other Ambulatory Visit (HOSPITAL_COMMUNITY): Payer: Self-pay

## 2013-11-06 ENCOUNTER — Other Ambulatory Visit (HOSPITAL_COMMUNITY): Payer: Self-pay

## 2014-01-17 ENCOUNTER — Ambulatory Visit: Payer: Self-pay | Admitting: Internal Medicine

## 2014-01-20 ENCOUNTER — Telehealth: Payer: Self-pay

## 2014-01-20 NOTE — Telephone Encounter (Signed)
Patient called for samples of brilinta placed them up front 

## 2014-01-22 ENCOUNTER — Ambulatory Visit: Payer: Self-pay | Admitting: Interventional Cardiology

## 2014-02-14 ENCOUNTER — Encounter: Payer: Self-pay | Admitting: Interventional Cardiology

## 2014-02-14 ENCOUNTER — Ambulatory Visit (INDEPENDENT_AMBULATORY_CARE_PROVIDER_SITE_OTHER): Payer: No Typology Code available for payment source | Admitting: Interventional Cardiology

## 2014-02-14 VITALS — BP 128/70 | HR 46 | Ht 63.0 in | Wt 146.0 lb

## 2014-02-14 DIAGNOSIS — E782 Mixed hyperlipidemia: Secondary | ICD-10-CM

## 2014-02-14 DIAGNOSIS — I252 Old myocardial infarction: Secondary | ICD-10-CM | POA: Insufficient documentation

## 2014-02-14 DIAGNOSIS — F17200 Nicotine dependence, unspecified, uncomplicated: Secondary | ICD-10-CM

## 2014-02-14 MED ORDER — CLOPIDOGREL BISULFATE 75 MG PO TABS: 75.0000 mg | ORAL_TABLET | Freq: Every day | ORAL | Status: DC

## 2014-02-14 NOTE — Patient Instructions (Addendum)
Your physician has recommended you make the following change in your medication:   1. Stop Brilinta after you run out of samples.  2. Start Plavix 75 mg 1 tab daily.   Your physician recommends that you return for a FASTING lipid profile: 02/19/14.   Your physician has requested that you have an echocardiogram. Echocardiography is a painless test that uses sound waves to create images of your heart. It provides your doctor with information about the size and shape of your heart and how well your heart's chambers and valves are working. This procedure takes approximately one hour. There are no restrictions for this procedure.  Your physician wants you to follow-up in: 4 months with Dr. Eldridge Dace. You will receive a reminder letter in the mail two months in advance. If you don't receive a letter, please call our office to schedule the follow-up appointment.

## 2014-02-14 NOTE — Progress Notes (Signed)
Patient ID: Michele Black, female   DOB: 05-19-1961, 53 y.o.   MRN: [id number] Patient ID: Michele Black, female   DOB: 05-19-1961, 53 y.o.   MRN: [id number]    [address] [place], [place]  [address] Phone: [phone] Fax:  [phone]  Date:  02/14/2014   ID:  Michele Black, DOB 05-19-1961, MRN [id number]  PCP:  Default, Provider, MD      History of Present Illness: Michele HolsterLisa Black is a 53 y.o. female who had an anterior MI in 10/14.  She presented late but had an LAD stent paced.  No Cp, but some SHOB, mostly with exertion, but this has improved.  Occasional lightheadedness with standing or bending down and standing up. No symptoms like what she had with her heart attack. She is trying to stop smoking. She has had some anxiety issues since her heart attack. This was also a problem before. No syncope, sweating, nausea or vomiting. No lower extremity swelling. No trouble lying flat.  She does mention that she does not have insurance and is concerned about cost of her medications.  She has trouble getting brilinta and has been out of the medicine for a few days.   Wt Readings from Last 3 Encounters:  02/14/14 146 lb (66.225 kg)  10/09/13 140 lb (63.504 kg)  09/24/13 140 lb (63.504 kg)     Past Medical History  Diagnosis Date  . Anxiety   . Arthritis   . Acute myocardial infarction of other anterior wall, initial episode of care   . Tobacco use disorder   . Mixed hyperlipidemia   . Ischemic cardiomyopathy     Current Outpatient Prescriptions  Medication Sig Dispense Refill  . ARIPiprazole (ABILIFY) 5 MG tablet Take 5 mg by mouth daily.      Marland Kitchen. aspirin 81 MG chewable tablet Chew 1 tablet (81 mg total) by mouth daily.      Marland Kitchen. atorvastatin (LIPITOR) 40 MG tablet Take 1 tablet (40 mg total) by mouth daily at 6 PM.  30 tablet  11  . furosemide (LASIX) 20 MG tablet Take 1 tablet (20 mg total) by mouth 2 (two) times daily as needed for fluid or edema (SHortness of breath).  60 tablet  6    . gabapentin (NEURONTIN) 100 MG capsule Take 100 mg by mouth 3 (three) times daily.      Marland Kitchen. lisinopril (PRINIVIL,ZESTRIL) 5 MG tablet Take 1 tablet (5 mg total) by mouth daily.  30 tablet  11  . metoprolol tartrate (LOPRESSOR) 25 MG tablet Take 1 tablet (25 mg total) by mouth 2 (two) times daily.  60 tablet  11  . nitroGLYCERIN (NITROSTAT) 0.4 MG SL tablet Place 1 tablet (0.4 mg total) under the tongue every 5 (five) minutes as needed for chest pain.  25 tablet  12  . potassium chloride SA (KLOR-CON M20) 20 MEQ tablet Take 2 tablets (40 mEq total) by mouth daily.  90 tablet  1  . sertraline (ZOLOFT) 100 MG tablet Take 100 mg by mouth daily.      . Ticagrelor (BRILINTA) 90 MG TABS tablet Take 1 tablet (90 mg total) by mouth 2 (two) times daily.  60 tablet  0  . traZODone (DESYREL) 100 MG tablet Take 100 mg by mouth at bedtime.       No current facility-administered medications for this visit.    Allergies:   No Known Allergies  Social History:  The patient  reports that she quit smoking  about 5 months ago. Her smoking use included Cigarettes. She smoked 1.00 pack per day. She does not have any smokeless tobacco history on file. She reports that she does not drink alcohol or use illicit drugs.   Family History:  The patient's family history includes Throat cancer in her mother.   ROS:  Please see the history of present illness.  No nausea, vomiting.  No fevers, chills.  No focal weakness.  No dysuria.    All other systems reviewed and negative.   PHYSICAL EXAM: VS:  BP 128/70  Pulse 46  Ht 5\' 3"  (1.6 m)  Wt 146 lb (66.225 kg)  BMI 25.87 kg/m2 Well nourished, well developed, in no acute distress HEENT: normal Neck: no JVD, no carotid bruits Cardiac:  normal S1, S2; RRR;  Lungs:  clear to auscultation bilaterally, no wheezing, rhonchi or rales Abd: soft, nontender, no hepatomegaly Ext: no edema Skin: warm and dry Neuro:   no focal abnormalities noted      ASSESSMENT AND  PLAN:  1. CAD/anterior MI/cardiomyopathy: No angina.  There is likely some LV dysfunction.  This may explain SHOB.  Will increase furosemide to 40 mg daily if Bmet today is ok.  SHe does not have much of an effect from the 20 mg daily.  Ejection fraction was 30% at the time of the catheterization. She likely has some residual systolic dysfunction. 2. She does not have prescription drug coverage. Therefore Brilinta will be prohibitively expensive.  She is trying to see if she can get this medication free from the drug company. She has sent her tax return in.  She never heard back.  We will switch her to clopidogrel 75 mg by mouth daily. Will give 180 mg today of Brilinta along with some samples.  Start Plavix after the Brilinta samples run out.. 3. Smoking: She has cut back but not stopped completely.  She has not bought any cigarettes.   4. Check echo  to evaluate LV function post MI.  It was ordered but not done a few months ago. 5. LDL 154 in 10/14. COntinue atorvastatin.  She is due for a recheck.   I stressed the importance of staying on DAPT to prevent stent thrombosis.  SHe is to call our office if she has any difficulty getting her meds.   Signed, Fredric Mare, MD, [facility] 02/14/2014 9:10 AM

## 2014-02-19 ENCOUNTER — Ambulatory Visit (HOSPITAL_COMMUNITY): Payer: No Typology Code available for payment source | Attending: Interventional Cardiology | Admitting: Radiology

## 2014-02-19 ENCOUNTER — Ambulatory Visit (INDEPENDENT_AMBULATORY_CARE_PROVIDER_SITE_OTHER): Payer: No Typology Code available for payment source | Admitting: *Deleted

## 2014-02-19 DIAGNOSIS — I2109 ST elevation (STEMI) myocardial infarction involving other coronary artery of anterior wall: Secondary | ICD-10-CM

## 2014-02-19 DIAGNOSIS — E782 Mixed hyperlipidemia: Secondary | ICD-10-CM

## 2014-02-19 DIAGNOSIS — I219 Acute myocardial infarction, unspecified: Secondary | ICD-10-CM

## 2014-02-19 DIAGNOSIS — I214 Non-ST elevation (NSTEMI) myocardial infarction: Secondary | ICD-10-CM | POA: Insufficient documentation

## 2014-02-19 DIAGNOSIS — I2589 Other forms of chronic ischemic heart disease: Secondary | ICD-10-CM

## 2014-02-19 LAB — LIPID PANEL
CHOL/HDL RATIO: 4
Cholesterol: 146 mg/dL (ref 0–200)
HDL: 33.5 mg/dL — ABNORMAL LOW (ref >39.00)
LDL Cholesterol: 83 mg/dL (ref 0–99)
Triglycerides: 149 mg/dL (ref 0.0–149.0)
VLDL: 29.8 mg/dL (ref 0.0–40.0)

## 2014-02-19 LAB — HEPATIC FUNCTION PANEL
ALT: 17 U/L (ref 0–35)
AST: 14 U/L (ref 0–37)
Albumin: 4.4 g/dL (ref 3.5–5.2)
Alkaline Phosphatase: 48 U/L (ref 39–117)
BILIRUBIN DIRECT: 0 mg/dL (ref 0.0–0.3)
Total Bilirubin: 0.7 mg/dL (ref 0.3–1.2)
Total Protein: 7.9 g/dL (ref 6.0–8.3)

## 2014-02-19 NOTE — Progress Notes (Signed)
Echocardiogram performed.  

## 2014-02-25 ENCOUNTER — Other Ambulatory Visit: Payer: Self-pay | Admitting: Cardiology

## 2014-02-25 DIAGNOSIS — E782 Mixed hyperlipidemia: Secondary | ICD-10-CM

## 2014-04-21 ENCOUNTER — Ambulatory Visit: Payer: No Typology Code available for payment source | Attending: Internal Medicine

## 2014-06-03 ENCOUNTER — Other Ambulatory Visit: Payer: Self-pay | Admitting: Internal Medicine

## 2014-06-05 ENCOUNTER — Telehealth: Payer: Self-pay | Admitting: Interventional Cardiology

## 2014-06-05 ENCOUNTER — Other Ambulatory Visit: Payer: Self-pay

## 2014-06-05 MED ORDER — CLOPIDOGREL BISULFATE 75 MG PO TABS: 75.0000 mg | ORAL_TABLET | Freq: Every day | ORAL | Status: DC

## 2014-06-05 MED ORDER — LISINOPRIL 5 MG PO TABS: 5.0000 mg | ORAL_TABLET | Freq: Every day | ORAL | Status: DC

## 2014-06-05 MED ORDER — POTASSIUM CHLORIDE CRYS ER 20 MEQ PO TBCR: 40.0000 meq | EXTENDED_RELEASE_TABLET | Freq: Every day | ORAL | Status: DC

## 2014-06-05 MED ORDER — FUROSEMIDE 20 MG PO TABS: 20.0000 mg | ORAL_TABLET | Freq: Two times a day (BID) | ORAL | Status: DC | PRN

## 2014-06-05 MED ORDER — ATORVASTATIN CALCIUM 40 MG PO TABS: 40.0000 mg | ORAL_TABLET | Freq: Every day | ORAL | Status: DC

## 2014-06-05 MED ORDER — METOPROLOL TARTRATE 25 MG PO TABS: 25.0000 mg | ORAL_TABLET | Freq: Two times a day (BID) | ORAL | Status: DC

## 2014-06-05 NOTE — Telephone Encounter (Signed)
error 

## 2014-07-08 ENCOUNTER — Other Ambulatory Visit: Payer: Self-pay | Admitting: Internal Medicine

## 2014-07-10 ENCOUNTER — Other Ambulatory Visit: Payer: Self-pay | Admitting: Internal Medicine

## 2014-07-24 ENCOUNTER — Ambulatory Visit: Payer: No Typology Code available for payment source | Admitting: Interventional Cardiology

## 2014-08-21 ENCOUNTER — Other Ambulatory Visit: Payer: No Typology Code available for payment source

## 2014-09-09 ENCOUNTER — Ambulatory Visit: Payer: No Typology Code available for payment source | Admitting: Interventional Cardiology

## 2014-10-18 IMAGING — CR DG CHEST 1V PORT
1 series · 1 of 1 positions shown · non-contrast
Comparison: None.

CLINICAL DATA: Chest pain

EXAM:
PORTABLE CHEST - 1 VIEW

[AP]
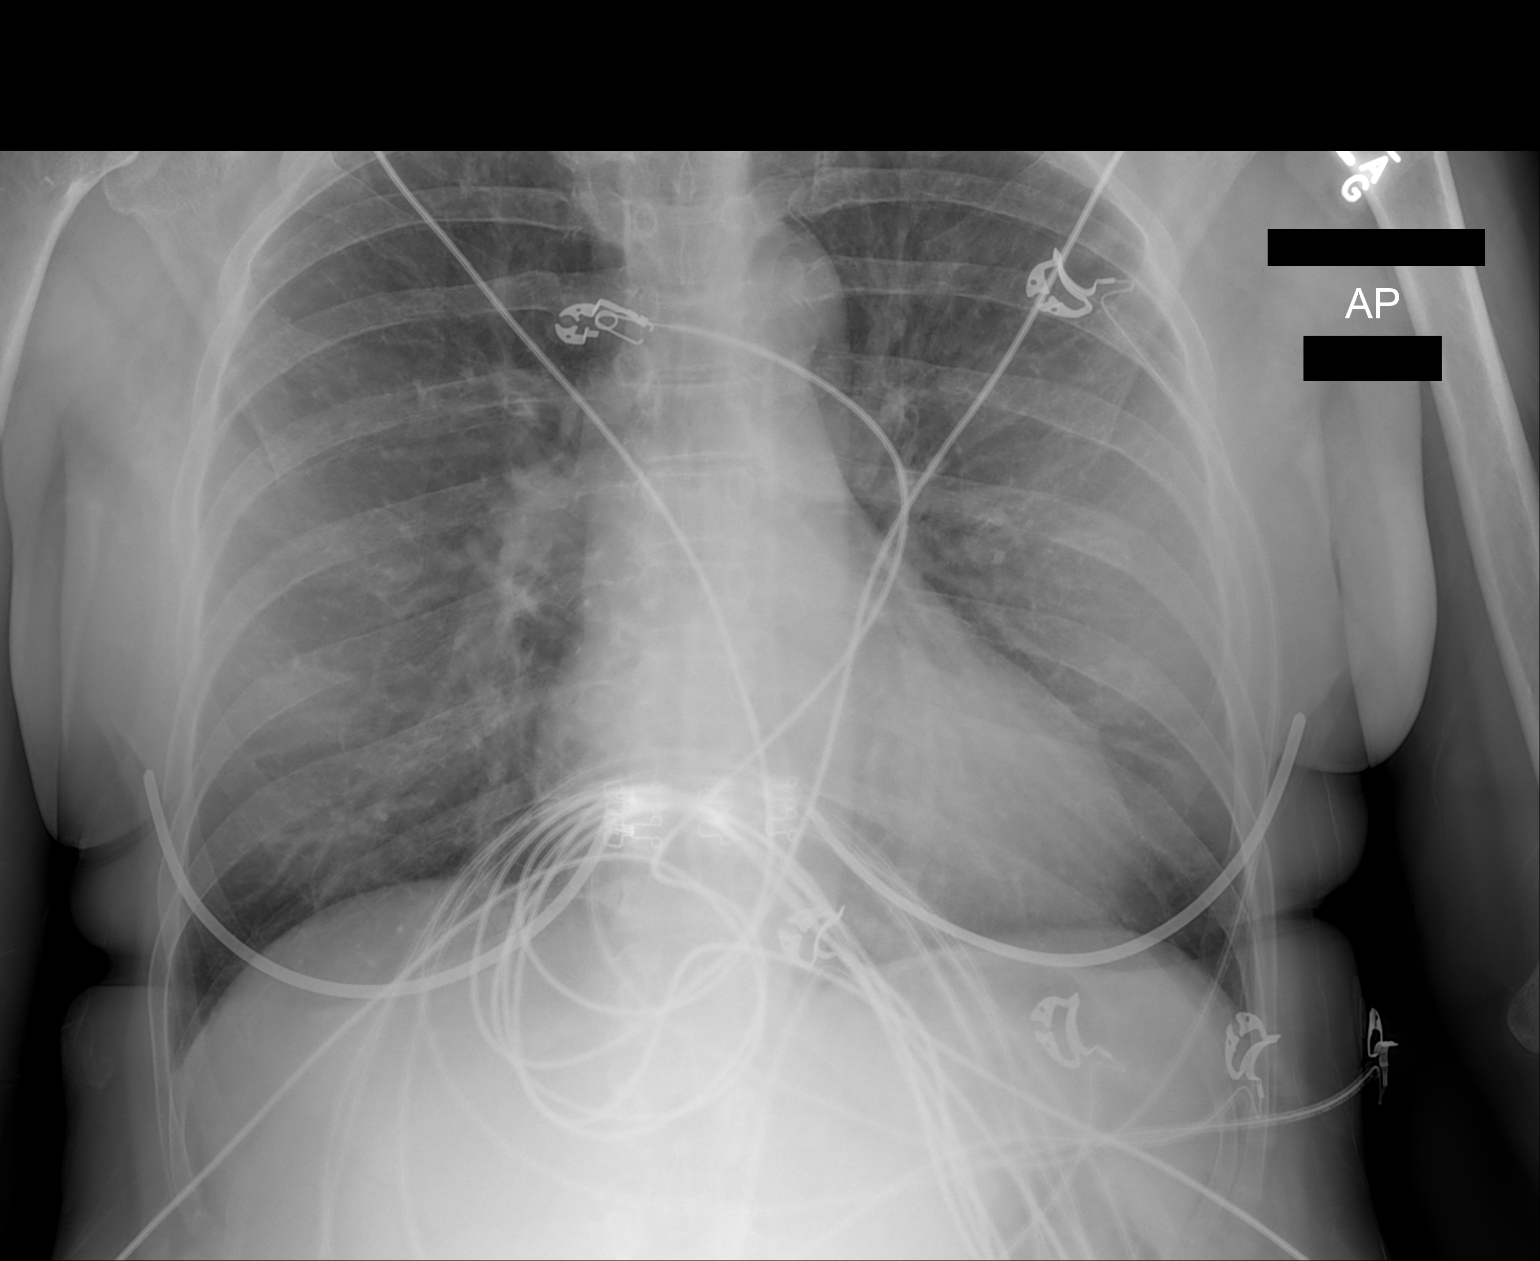

[1 of 1 positions shown; findings below may reference images not displayed]

FINDINGS: The heart size and mediastinal contours are within normal limits for
technique. Both lungs are clear. The visualized skeletal structures
are unremarkable.
IMPRESSION: No active disease.

## 2014-10-30 ENCOUNTER — Encounter (HOSPITAL_COMMUNITY): Payer: Self-pay | Admitting: Interventional Cardiology

## 2014-11-05 ENCOUNTER — Ambulatory Visit (INDEPENDENT_AMBULATORY_CARE_PROVIDER_SITE_OTHER): Payer: No Typology Code available for payment source | Admitting: Interventional Cardiology

## 2014-11-05 ENCOUNTER — Encounter: Payer: Self-pay | Admitting: Interventional Cardiology

## 2014-11-05 VITALS — BP 128/66 | HR 64 | Ht 62.0 in | Wt 148.8 lb

## 2014-11-05 DIAGNOSIS — I252 Old myocardial infarction: Secondary | ICD-10-CM

## 2014-11-05 DIAGNOSIS — F17200 Nicotine dependence, unspecified, uncomplicated: Secondary | ICD-10-CM

## 2014-11-05 DIAGNOSIS — Z720 Tobacco use: Secondary | ICD-10-CM

## 2014-11-05 DIAGNOSIS — E782 Mixed hyperlipidemia: Secondary | ICD-10-CM

## 2014-11-05 DIAGNOSIS — I2510 Atherosclerotic heart disease of native coronary artery without angina pectoris: Secondary | ICD-10-CM

## 2014-11-05 MED ORDER — NICOTINE 21 MG/24HR TD PT24: 21.0000 mg | MEDICATED_PATCH | Freq: Every day | TRANSDERMAL | Status: DC

## 2014-11-05 NOTE — Patient Instructions (Signed)
Your physician has recommended you make the following change in your medication:   Start Nicotine 21mg  Patch     Your physician recommends that you return for lab work in: April for fasting lipid and CMET    Your physician wants you to follow-up in: 6 month with Dr. Eldridge Dace.  You will receive a reminder letter in the mail two months in advance. If you don't receive a letter, please call our office to schedule the follow-up appointment.

## 2014-11-05 NOTE — Progress Notes (Signed)
Patient ID: Michele Black, female   DOB: 1961-08-28, 53 y.o.   MRN: [id number]    [address] [place], [place]  [address] Phone: [phone] Fax:  [phone]  Date:  11/05/2014   ID:  Michele Black, DOB Dec 18, 1960, MRN [id number]  PCP:  Default, Provider, MD      History of Present Illness: Michele Black is a 53 y.o. female who had an anterior MI in 10/14.  She presented late but had an LAD stent paced.  No Cp, decreased SHOB, still mostly with exertion, but this has improved.  Occasional lightheadedness with standing or bending down and standing up. No symptoms like what she had with her heart attack. She is still smoking. She has had some anxiety issues since her heart attack, but this has imprvoed. This was also a problem before. No syncope, sweating, nausea or vomiting. No lower extremity swelling. No trouble lying flat.  She is now able to get her current meds without any problems.  No bleeding problems.   She walks a lot at work.  No NTG use.  Walks her dog as well.     Wt Readings from Last 3 Encounters:  11/05/14 148 lb 12.8 oz (67.495 kg)  02/14/14 146 lb (66.225 kg)  10/09/13 140 lb (63.504 kg)     Past Medical History  Diagnosis Date  . Anxiety   . Arthritis   . Acute myocardial infarction of other anterior wall, initial episode of care   . Tobacco use disorder   . Mixed hyperlipidemia   . Ischemic cardiomyopathy     Current Outpatient Prescriptions  Medication Sig Dispense Refill  . ARIPiprazole (ABILIFY) 5 MG tablet Take 5 mg by mouth daily.    Marland Kitchen aspirin 81 MG chewable tablet Chew 1 tablet (81 mg total) by mouth daily.    Marland Kitchen atorvastatin (LIPITOR) 40 MG tablet Take 1 tablet (40 mg total) by mouth daily at 6 PM. 90 tablet 0  . clopidogrel (PLAVIX) 75 MG tablet Take 1 tablet (75 mg total) by mouth daily. 90 tablet 0  . furosemide (LASIX) 20 MG tablet Take 1 tablet (20 mg total) by mouth 2 (two) times daily as needed for fluid or edema (SHortness of breath).  180 tablet 0  . gabapentin (NEURONTIN) 100 MG capsule Take 100 mg by mouth 3 (three) times daily.    Marland Kitchen lisinopril (PRINIVIL,ZESTRIL) 5 MG tablet Take 1 tablet (5 mg total) by mouth daily. 90 tablet 0  . metoprolol tartrate (LOPRESSOR) 25 MG tablet Take 1 tablet (25 mg total) by mouth 2 (two) times daily. 180 tablet 0  . nitroGLYCERIN (NITROSTAT) 0.4 MG SL tablet Place 1 tablet (0.4 mg total) under the tongue every 5 (five) minutes as needed for chest pain. 25 tablet 12  . potassium chloride SA (KLOR-CON M20) 20 MEQ tablet Take 2 tablets (40 mEq total) by mouth daily. 90 tablet 0  . sertraline (ZOLOFT) 100 MG tablet Take 100 mg by mouth daily.    . traZODone (DESYREL) 100 MG tablet Take 100 mg by mouth at bedtime.     No current facility-administered medications for this visit.    Allergies:   No Known Allergies  Social History:  The patient  reports that she has been smoking Cigarettes.  She has been smoking about 0.50 packs per day. She does not have any smokeless tobacco history on file. She reports that she does not drink alcohol or use illicit drugs.   Family History:  The patient's family history includes Throat cancer in her mother.   ROS:  Please see the history of present illness.  No nausea, vomiting.  No fevers, chills.  No focal weakness.  No dysuria.    All other systems reviewed and negative.   PHYSICAL EXAM: VS:  BP 128/66 mmHg  Pulse 64  Ht 5\' 2"  (1.575 m)  Wt 148 lb 12.8 oz (67.495 kg)  BMI 27.21 kg/m2 Well nourished, well developed, in no acute distress HEENT: normal Neck: no JVD, no carotid bruits Cardiac:  normal S1, S2; RRR;  Lungs:  clear to auscultation bilaterally, no wheezing, rhonchi or rales Abd: soft, nontender, no hepatomegaly Ext: no edema Skin: warm and dry Neuro:   no focal abnormalities noted Psych: normal affect      ASSESSMENT AND PLAN:  1. CAD/old anterior MI/cardiomyopathy: No angina.  LV function has returned to normal by 4/15 echo.  No  longer needing to take furosemide.  Ejection fraction was 30% at the time of the catheterization. No residual systolic dysfunction by most recent echo. 2. Able to get clopidogrel 75 mg by mouth daily.  3. Smoking: She has cut back but not stopped completely.  She is willing to try nicotine patches. Will prescribe 21 mg patches with refills.   4. LDL 154 in 10/14. COntinue atorvastatin.  LDL 83 in 4/15.   Repeat lipids in 4/16.  I stressed the importance of staying on DAPT to prevent stent thrombosis.   Also, recommended that she stop smoking and get at least 150 minutes of exercise /week.  Signed, Fredric MareJay S. Charlena Haub, MD, [facility] 11/05/2014 9:29 AM

## 2015-02-11 ENCOUNTER — Other Ambulatory Visit: Payer: Self-pay | Admitting: Interventional Cardiology

## 2015-02-24 ENCOUNTER — Other Ambulatory Visit: Payer: No Typology Code available for payment source

## 2015-02-24 ENCOUNTER — Other Ambulatory Visit: Payer: Self-pay | Admitting: Interventional Cardiology

## 2015-04-30 ENCOUNTER — Other Ambulatory Visit: Payer: Self-pay | Admitting: *Deleted

## 2015-04-30 DIAGNOSIS — E782 Mixed hyperlipidemia: Secondary | ICD-10-CM

## 2015-05-06 ENCOUNTER — Other Ambulatory Visit: Payer: No Typology Code available for payment source

## 2015-05-13 ENCOUNTER — Ambulatory Visit: Payer: No Typology Code available for payment source | Admitting: Interventional Cardiology

## 2015-07-21 ENCOUNTER — Ambulatory Visit: Payer: No Typology Code available for payment source | Admitting: Interventional Cardiology

## 2015-07-22 ENCOUNTER — Encounter: Payer: Self-pay | Admitting: Interventional Cardiology

## 2015-12-02 ENCOUNTER — Other Ambulatory Visit: Payer: Self-pay | Admitting: Interventional Cardiology

## 2015-12-16 MED FILL — CLOPIDOGREL 75 MG TABLET: 75 | 30 days supply | Qty: 30 | Fill #6

## 2015-12-16 MED FILL — ?METOPROLOL 25 MG TABLET: 25 | 30 days supply | Qty: 60 | Fill #0

## 2015-12-16 MED FILL — ?ATORVASTATIN 40MG TABLET: 40 | 30 days supply | Qty: 30 | Fill #0

## 2015-12-16 MED FILL — LISINOPRIL 5 MG TABLET: 5 | 30 days supply | Qty: 30 | Fill #0

## 2016-01-18 MED FILL — CLOPIDOGREL 75 MG TABLET: 75 | 30 days supply | Qty: 30 | Fill #7

## 2016-01-18 MED FILL — METOPROLOL TARTRATE 25 MG T: 25 | 30 days supply | Qty: 60 | Fill #1

## 2016-01-18 MED FILL — ?ATORVASTATIN 40MG TABLET: 40 | 30 days supply | Qty: 30 | Fill #1

## 2016-01-18 MED FILL — LISINOPRIL 5 MG TABLET: 5 | 30 days supply | Qty: 30 | Fill #1

## 2016-02-26 ENCOUNTER — Other Ambulatory Visit: Payer: Self-pay | Admitting: Interventional Cardiology

## 2016-02-26 MED FILL — ?METOPROLOL 25 MG TABLET: 25 | 30 days supply | Qty: 60 | Fill #2

## 2016-02-26 MED FILL — ATORVASTATIN 40 MG TABLET: 40 | 30 days supply | Qty: 30 | Fill #2

## 2016-02-26 MED FILL — LISINOPRIL 5 MG TABLET: 5 | 30 days supply | Qty: 30 | Fill #2

## 2016-03-07 ENCOUNTER — Other Ambulatory Visit: Payer: Self-pay | Admitting: Interventional Cardiology

## 2016-03-21 ENCOUNTER — Other Ambulatory Visit: Payer: Self-pay | Admitting: Interventional Cardiology

## 2016-03-23 NOTE — Telephone Encounter (Signed)
Patient has not been seen since 2015 and had two scheduled appointments last year, but no-showed/cancelled. Thanks, MI

## 2016-03-23 NOTE — Telephone Encounter (Signed)
Can give 30 day supply with a message to call office to schedule an appt. If she does not we can refer all of her further refills to her PCP. Thanks.

## 2016-04-05 ENCOUNTER — Other Ambulatory Visit: Payer: Self-pay | Admitting: Interventional Cardiology

## 2016-04-05 MED FILL — METOPROLOL TARTRATE 25 MG T: 25 | 30 days supply | Qty: 60 | Fill #3

## 2016-04-05 MED FILL — CLOPIDOGREL 75 MG TABLET: 75 | 30 days supply | Qty: 30 | Fill #0

## 2016-04-05 MED FILL — LISINOPRIL 5 MG TABLET: 5 | 30 days supply | Qty: 30 | Fill #0

## 2016-04-05 MED FILL — ?ATORVASTATIN 40MG TABLET: 40 | 30 days supply | Qty: 30 | Fill #3

## 2016-04-13 ENCOUNTER — Encounter: Payer: Self-pay | Admitting: Physician Assistant

## 2016-04-14 ENCOUNTER — Encounter: Payer: Self-pay | Admitting: Physician Assistant

## 2016-04-25 ENCOUNTER — Ambulatory Visit: Payer: No Typology Code available for payment source | Admitting: Physician Assistant

## 2016-05-02 ENCOUNTER — Ambulatory Visit: Payer: No Typology Code available for payment source | Admitting: Physician Assistant

## 2016-05-09 MED FILL — METOPROLOL TARTRATE 25 MG T: 25 | 30 days supply | Qty: 60 | Fill #4

## 2016-05-09 MED FILL — ATORVASTATIN 40 MG TABLET: 40 | 30 days supply | Qty: 30 | Fill #4

## 2016-05-16 ENCOUNTER — Encounter: Payer: Self-pay | Admitting: Physician Assistant

## 2016-06-07 ENCOUNTER — Ambulatory Visit: Payer: No Typology Code available for payment source | Admitting: Interventional Cardiology

## 2016-06-09 MED FILL — ?ATORVASTATIN 40MG TABLET: 40 | 30 days supply | Qty: 30 | Fill #5

## 2016-06-09 MED FILL — ?METOPROLOL 25 MG TABLET: 25 | 30 days supply | Qty: 60 | Fill #5

## 2016-06-13 ENCOUNTER — Telehealth: Payer: Self-pay | Admitting: Interventional Cardiology

## 2016-06-13 MED ORDER — LISINOPRIL 5 MG PO TABS: 5.0000 mg | ORAL_TABLET | Freq: Every day | ORAL | 0 refills | Status: DC

## 2016-06-13 MED ORDER — CLOPIDOGREL BISULFATE 75 MG PO TABS
75.0000 mg | ORAL_TABLET | Freq: Every day | ORAL | 0 refills | Status: DC
Start: 2016-06-13 — End: 2016-07-14

## 2016-06-13 MED FILL — CLOPIDOGREL 75 MG TABLET: 75 | 30 days supply | Qty: 30 | Fill #0

## 2016-06-13 MED FILL — ?LISINOPRIL 5 MG TABLET: 5 | 30 days supply | Qty: 30 | Fill #0

## 2016-06-13 NOTE — Telephone Encounter (Signed)
Refill sent.

## 2016-06-13 NOTE — Telephone Encounter (Signed)
°*  STAT* If patient is at the pharmacy, call can be transferred to refill team.   1. Which medications need to be refilled? (please list name of each medication and dose if known) Clopidogrel and Lisinopril-prescriptions-does not have an appt until  Sept 18th  2. Which pharmacy/location (including street and city if local pharmacy) is medication to be sent to?[facility] [phone]  3. Do they need a 30 day or 90 day supply?30 and refills

## 2016-07-14 ENCOUNTER — Other Ambulatory Visit: Payer: Self-pay | Admitting: *Deleted

## 2016-07-14 MED ORDER — LISINOPRIL 5 MG PO TABS: 5.0000 mg | ORAL_TABLET | Freq: Every day | ORAL | 0 refills | Status: DC

## 2016-07-14 MED ORDER — FUROSEMIDE 20 MG PO TABS: 20.0000 mg | ORAL_TABLET | Freq: Two times a day (BID) | ORAL | 0 refills | Status: DC | PRN

## 2016-07-14 MED ORDER — ATORVASTATIN CALCIUM 40 MG PO TABS: ORAL_TABLET | ORAL | 0 refills | Status: DC

## 2016-07-14 MED ORDER — METOPROLOL TARTRATE 25 MG PO TABS: 25.0000 mg | ORAL_TABLET | Freq: Two times a day (BID) | ORAL | 0 refills | Status: DC

## 2016-07-14 MED ORDER — CLOPIDOGREL BISULFATE 75 MG PO TABS: 75.0000 mg | ORAL_TABLET | Freq: Every day | ORAL | 0 refills | Status: DC

## 2016-07-21 ENCOUNTER — Encounter: Payer: Self-pay | Admitting: *Deleted

## 2016-07-27 MED FILL — LISINOPRIL 5 MG TABLET: 5 | 30 days supply | Qty: 30 | Fill #0

## 2016-07-27 MED FILL — CLOPIDOGREL 75 MG TABLET: 75 | 30 days supply | Qty: 30 | Fill #0

## 2016-07-27 MED FILL — ?FUROSEMIDE 20 MG TABLET: 20 | 15 days supply | Qty: 30 | Fill #0

## 2016-07-27 MED FILL — METOPROLOL TARTRATE 25 MG T: 25 | 30 days supply | Qty: 60 | Fill #0

## 2016-07-27 MED FILL — ?ATORVASTATIN 40MG TABLET: 40 | 30 days supply | Qty: 30 | Fill #0

## 2016-08-08 ENCOUNTER — Ambulatory Visit (INDEPENDENT_AMBULATORY_CARE_PROVIDER_SITE_OTHER): Payer: No Typology Code available for payment source | Admitting: Interventional Cardiology

## 2016-08-08 ENCOUNTER — Encounter: Payer: Self-pay | Admitting: Interventional Cardiology

## 2016-08-08 ENCOUNTER — Encounter (INDEPENDENT_AMBULATORY_CARE_PROVIDER_SITE_OTHER): Payer: Self-pay

## 2016-08-08 VITALS — BP 119/70 | HR 45 | Ht 62.0 in | Wt 137.0 lb

## 2016-08-08 DIAGNOSIS — F17200 Nicotine dependence, unspecified, uncomplicated: Secondary | ICD-10-CM

## 2016-08-08 DIAGNOSIS — I2510 Atherosclerotic heart disease of native coronary artery without angina pectoris: Secondary | ICD-10-CM

## 2016-08-08 DIAGNOSIS — I252 Old myocardial infarction: Secondary | ICD-10-CM

## 2016-08-08 DIAGNOSIS — I255 Ischemic cardiomyopathy: Secondary | ICD-10-CM

## 2016-08-08 DIAGNOSIS — E782 Mixed hyperlipidemia: Secondary | ICD-10-CM

## 2016-08-08 LAB — COMPREHENSIVE METABOLIC PANEL
ALBUMIN: 4.1 g/dL (ref 3.6–5.1)
ALT: 12 U/L (ref 6–29)
AST: 12 U/L (ref 10–35)
Alkaline Phosphatase: 41 U/L (ref 33–130)
BILIRUBIN TOTAL: 0.3 mg/dL (ref 0.2–1.2)
BUN: 10 mg/dL (ref 7–25)
CHLORIDE: 104 mmol/L (ref 98–110)
CO2: 28 mmol/L (ref 20–31)
CREATININE: 0.75 mg/dL (ref 0.50–1.05)
Calcium: 8.8 mg/dL (ref 8.6–10.4)
Glucose, Bld: 96 mg/dL (ref 65–99)
Potassium: 4.1 mmol/L (ref 3.5–5.3)
SODIUM: 139 mmol/L (ref 135–146)
Total Protein: 6.6 g/dL (ref 6.1–8.1)

## 2016-08-08 LAB — LIPID PANEL
CHOLESTEROL: 131 mg/dL (ref 125–200)
HDL: 38 mg/dL — ABNORMAL LOW (ref >=46)
LDL Cholesterol: 79 mg/dL (ref <130)
TRIGLYCERIDES: 68 mg/dL (ref <150)
Total CHOL/HDL Ratio: 3.4 Ratio (ref <=5.0)
VLDL: 14 mg/dL (ref <30)

## 2016-08-08 MED ORDER — POTASSIUM CHLORIDE CRYS ER 20 MEQ PO TBCR: 40.0000 meq | EXTENDED_RELEASE_TABLET | Freq: Every day | ORAL | 1 refills | Status: DC

## 2016-08-08 MED ORDER — METOPROLOL TARTRATE 25 MG PO TABS: 12.5000 mg | ORAL_TABLET | Freq: Two times a day (BID) | ORAL | 3 refills | Status: DC

## 2016-08-08 MED ORDER — NITROGLYCERIN 0.4 MG SL SUBL: 0.4000 mg | SUBLINGUAL_TABLET | SUBLINGUAL | 3 refills | Status: DC | PRN

## 2016-08-08 MED ORDER — ATORVASTATIN CALCIUM 40 MG PO TABS: ORAL_TABLET | ORAL | 1 refills | Status: DC

## 2016-08-08 MED ORDER — FUROSEMIDE 20 MG PO TABS: 20.0000 mg | ORAL_TABLET | Freq: Two times a day (BID) | ORAL | 1 refills | Status: DC | PRN

## 2016-08-08 MED ORDER — LISINOPRIL 5 MG PO TABS: 5.0000 mg | ORAL_TABLET | Freq: Every day | ORAL | 0 refills | Status: DC

## 2016-08-08 MED ORDER — NICOTINE 21 MG/24HR TD PT24: 21.0000 mg | MEDICATED_PATCH | Freq: Every day | TRANSDERMAL | 6 refills | Status: DC

## 2016-08-08 MED ORDER — CLOPIDOGREL BISULFATE 75 MG PO TABS: 75.0000 mg | ORAL_TABLET | Freq: Every day | ORAL | 1 refills | Status: DC

## 2016-08-08 NOTE — Patient Instructions (Signed)
Medication Instructions:  Decrease Metoprolol to 12.5 mg twice daily Start using Nicoderm patches as directed. All other medications remain the same.  Labwork: Today-Lipids and CMET  Testing/Procedures: None  Follow-Up: Your physician wants you to follow-up in: 6 months. You will receive a reminder letter in the mail two months in advance. If you don't receive a letter, please call our office to schedule the follow-up appointment.     If you need a refill on your cardiac medications before your next appointment, please call your pharmacy.

## 2016-08-08 NOTE — Progress Notes (Signed)
Cardiology Office Note   Date:  08/08/2016   ID:  Michele HolsterLisa Black, DOB [birth year], MRN [id number]  PCP:  Default, Provider, MD    No chief complaint on file. f/u CAD   Wt Readings from Last 3 Encounters:  08/08/16 137 lb (62.1 kg)  11/05/14 148 lb 12.8 oz (67.5 kg)  02/14/14 146 lb (66.2 kg)       History of Present Illness: Michele Black is a 55 y.o. female  who had an anterior MI in 10/14.  She presented late but had an LAD stent placed.  No Cp, no SHOB.  No further lightheadedness with standing or bending down and standing up.   No symptoms like what she had with her heart attack. She is still smoking. She has had some anxiety issues since her heart attack, but this has improved with medication. This was also a problem before.   No syncope, sweating, nausea or vomiting. No lower extremity swelling. No trouble lying flat.  Occasional knee pain from arthritis.  She is now able to get her current meds without any problems.  No bleeding problems.   She walks a lot at work.  No NTG use.  Walks her dog as well.    Overall, she feels she is doing well.  She works for [employer].    Past Medical History:  Diagnosis Date  . Acute myocardial infarction of other anterior wall, initial episode of care   . Anxiety   . Arthritis   . Ischemic cardiomyopathy   . Mixed hyperlipidemia   . Tobacco use disorder     Past Surgical History:  Procedure Laterality Date  . LEFT HEART CATHETERIZATION WITH CORONARY ANGIOGRAM Bilateral 09/05/2013   Procedure: LEFT HEART CATHETERIZATION WITH CORONARY ANGIOGRAM;  Surgeon: Corky CraftsJayadeep S Camillo Quadros, MD;  [facility];  Service: Cardiovascular;  Laterality: Bilateral;  . PERCUTANEOUS CORONARY STENT INTERVENTION (PCI-S)  09/05/2013   Procedure: PERCUTANEOUS CORONARY STENT INTERVENTION (PCI-S);  Surgeon: Corky CraftsJayadeep S Ollen Rao, MD;  [facility];  Service: Cardiovascular;;  DES to mid LAD     Current Outpatient Prescriptions   Medication Sig Dispense Refill  . ARIPiprazole (ABILIFY) 5 MG tablet Take 5 mg by mouth daily.    Marland Kitchen. aspirin 81 MG chewable tablet Chew 1 tablet (81 mg total) by mouth daily.    Marland Kitchen. atorvastatin (LIPITOR) 40 MG tablet TAKE 1 TABLET BY MOUTH DAILY AT 6 PM. 30 tablet 0  . clopidogrel (PLAVIX) 75 MG tablet Take 1 tablet (75 mg total) by mouth daily. 30 tablet 0  . furosemide (LASIX) 20 MG tablet Take 1 tablet (20 mg total) by mouth 2 (two) times daily as needed for fluid or edema (SHortness of breath). 30 tablet 0  . gabapentin (NEURONTIN) 100 MG capsule Take 100 mg by mouth 3 (three) times daily.    Marland Kitchen. lisinopril (PRINIVIL,ZESTRIL) 5 MG tablet Take 1 tablet (5 mg total) by mouth daily. 30 tablet 0  . metoprolol tartrate (LOPRESSOR) 25 MG tablet Take 1 tablet (25 mg total) by mouth 2 (two) times daily. 60 tablet 0  . nitroGLYCERIN (NITROSTAT) 0.4 MG SL tablet Place 0.4 mg under the tongue every 5 (five) minutes as needed for chest pain. 3 DOSES MAX    . potassium chloride SA (KLOR-CON M20) 20 MEQ tablet Take 2 tablets (40 mEq total) by mouth daily. 90 tablet 0  . sertraline (ZOLOFT) 100 MG tablet Take 100 mg by mouth daily.    .Marland Kitchen  traZODone (DESYREL) 100 MG tablet Take 100 mg by mouth at bedtime.     No current facility-administered medications for this visit.     Allergies:   Review of patient's allergies indicates no known allergies.    Social History:  The patient  reports that she has been smoking Cigarettes.  She has been smoking about 0.50 packs per day. She does not have any smokeless tobacco history on file. She reports that she does not drink alcohol or use drugs.   Family History:  The patient's family history includes Throat cancer in her mother.    ROS:  Please see the history of present illness.   Otherwise, review of systems are positive for unable to stop smoking; knee pain.   All other systems are reviewed and negative.    PHYSICAL EXAM: VS:  BP 119/70   Pulse (!) 45   Ht 5'  2" (1.575 m)   Wt 137 lb (62.1 kg)   BMI 25.06 kg/m  , BMI Body mass index is 25.06 kg/m. GEN: Well nourished, well developed, in no acute distress  HEENT: normal  Neck: no JVD, carotid bruits, or masses Cardiac: RRR; no murmurs, rubs, or gallops,no edema  Respiratory:  clear to auscultation bilaterally, normal work of breathing GI: soft, nontender, nondistended, + BS MS: no deformity or atrophy  Skin: warm and dry, no rash Neuro:  Strength and sensation are intact Psych: euthymic mood, full affect   EKG:   The ekg ordered today demonstrates sinus bradycardia, prior anterior MI, no ST segment changes   Recent Labs: No results found for requested labs within last 8760 hours.   Lipid Panel    Component Value Date/Time   CHOL 146 02/19/2014 0931   TRIG 149.0 02/19/2014 0931   HDL 33.50 (L) 02/19/2014 0931   CHOLHDL 4 02/19/2014 0931   VLDL 29.8 02/19/2014 0931   LDLCALC 83 02/19/2014 0931     Other studies Reviewed: Additional studies/ records that were reviewed today with results demonstrating: Nl LV function in April 2015.   ASSESSMENT AND PLAN:  1. CAD/old anterior MI/cardiomyopathy: No angina.  LV function has returned to normal by 4/15 echo.  No longer needing to take furosemide, and no issues with swelling.  Ejection fraction was 30% at the time of the catheterization. No residual systolic dysfunction by most recent echo in 2015, as noted above.  No sign of CHF.  2. Smoking: She has cut back but not stopped completely.  She is willing to try nicotine patches. Will prescribe 21 mg patches with refills.  She did not fill this Rx. The last time we prescribed this. 3. Hyperlipidemia: Check lipids today.  4. Decrease metoprolol to 12.5 mg BID due to bradycardia.   5. F/u in 6 months.    Current medicines are reviewed at length with the patient today.  The patient concerns regarding her medicines were addressed.  The following changes have been made:  No change  Labs/  tests ordered today include:  No orders of the defined types were placed in this encounter.   Recommend 150 minutes/week of aerobic exercise Low fat, low carb, high fiber diet recommended  Disposition:   FU in 6 months   Signed, Lance Muss, MD  08/08/2016 10:03 AM    [facility] [address] [place], [place], [place]  [address] Phone: [phone]; Fax: [phone]

## 2016-08-24 ENCOUNTER — Other Ambulatory Visit: Payer: Self-pay | Admitting: Interventional Cardiology

## 2016-08-24 MED FILL — CLOPIDOGREL 75 MG TABLET: 75 | 30 days supply | Qty: 30 | Fill #0

## 2016-08-24 MED FILL — ATORVASTATIN 40 MG TABLET: 40 | 30 days supply | Qty: 30 | Fill #0

## 2016-08-24 MED FILL — FUROSEMIDE 20 MG TABLET: 20 | 15 days supply | Qty: 30 | Fill #0

## 2016-08-24 MED FILL — METOPROLOL TARTRATE 25 MG T: 25 | 30 days supply | Qty: 30 | Fill #0

## 2016-08-24 MED FILL — ?LISINOPRIL 5 MG TABLET: 5 | 30 days supply | Qty: 30 | Fill #0

## 2016-10-03 MED FILL — METOPROLOL TARTRATE 25 MG T: 25 | 30 days supply | Qty: 30 | Fill #1

## 2016-10-03 MED FILL — ATORVASTATIN 40 MG TABLET: 40 | 30 days supply | Qty: 30 | Fill #1

## 2016-10-03 MED FILL — CLOPIDOGREL 75 MG TABLET: 75 | 30 days supply | Qty: 30 | Fill #1

## 2016-10-03 MED FILL — LISINOPRIL 5 MG TABLET: 5 | 30 days supply | Qty: 30 | Fill #1

## 2016-12-27 MED FILL — CLOPIDOGREL 75 MG TABLET: 75 | 30 days supply | Qty: 30 | Fill #2

## 2016-12-27 MED FILL — FUROSEMIDE 20 MG TABLET: 20 | 15 days supply | Qty: 30 | Fill #1

## 2016-12-27 MED FILL — ATORVASTATIN 40 MG TABLET: 40 | 30 days supply | Qty: 30 | Fill #2

## 2016-12-27 MED FILL — METOPROLOL TARTRATE 25 MG T: 25 | 30 days supply | Qty: 30 | Fill #2

## 2016-12-27 MED FILL — LISINOPRIL 5 MG TABLET: 5 | 30 days supply | Qty: 30 | Fill #2

## 2017-01-24 ENCOUNTER — Encounter: Payer: Self-pay | Admitting: Interventional Cardiology

## 2017-01-26 ENCOUNTER — Emergency Department (HOSPITAL_COMMUNITY)
Admission: EM | Admit: 2017-01-26 | Discharge: 2017-01-26 | Disposition: A | Discharge status: Home / Self Care | Payer: No Typology Code available for payment source | Attending: Emergency Medicine | Admitting: Emergency Medicine

## 2017-01-26 ENCOUNTER — Encounter (HOSPITAL_COMMUNITY): Payer: Self-pay

## 2017-01-26 ENCOUNTER — Emergency Department (HOSPITAL_COMMUNITY): Payer: No Typology Code available for payment source

## 2017-01-26 DIAGNOSIS — Z7902 Long term (current) use of antithrombotics/antiplatelets: Secondary | ICD-10-CM | POA: Insufficient documentation

## 2017-01-26 DIAGNOSIS — I252 Old myocardial infarction: Secondary | ICD-10-CM | POA: Insufficient documentation

## 2017-01-26 DIAGNOSIS — Z955 Presence of coronary angioplasty implant and graft: Secondary | ICD-10-CM | POA: Insufficient documentation

## 2017-01-26 DIAGNOSIS — Z7982 Long term (current) use of aspirin: Secondary | ICD-10-CM | POA: Insufficient documentation

## 2017-01-26 DIAGNOSIS — F17210 Nicotine dependence, cigarettes, uncomplicated: Secondary | ICD-10-CM | POA: Insufficient documentation

## 2017-01-26 DIAGNOSIS — Z79899 Other long term (current) drug therapy: Secondary | ICD-10-CM | POA: Insufficient documentation

## 2017-01-26 DIAGNOSIS — J111 Influenza due to unidentified influenza virus with other respiratory manifestations: Secondary | ICD-10-CM | POA: Insufficient documentation

## 2017-01-26 DIAGNOSIS — R69 Illness, unspecified: Secondary | ICD-10-CM

## 2017-01-26 DIAGNOSIS — R197 Diarrhea, unspecified: Secondary | ICD-10-CM | POA: Insufficient documentation

## 2017-01-26 DIAGNOSIS — R112 Nausea with vomiting, unspecified: Secondary | ICD-10-CM

## 2017-01-26 DIAGNOSIS — I2510 Atherosclerotic heart disease of native coronary artery without angina pectoris: Secondary | ICD-10-CM | POA: Insufficient documentation

## 2017-01-26 LAB — URINALYSIS, ROUTINE W REFLEX MICROSCOPIC
BILIRUBIN URINE: NEGATIVE
Glucose, UA: NEGATIVE mg/dL
KETONES UR: NEGATIVE mg/dL
NITRITE: NEGATIVE
PH: 6 (ref 5.0–8.0)
PROTEIN: NEGATIVE mg/dL
Specific Gravity, Urine: 1.008 (ref 1.005–1.030)

## 2017-01-26 LAB — COMPREHENSIVE METABOLIC PANEL
ALBUMIN: 4.3 g/dL (ref 3.5–5.0)
ALT: 19 U/L (ref 14–54)
ANION GAP: 7 (ref 5–15)
AST: 20 U/L (ref 15–41)
Alkaline Phosphatase: 49 U/L (ref 38–126)
BUN: 11 mg/dL (ref 6–20)
CHLORIDE: 101 mmol/L (ref 101–111)
CO2: 28 mmol/L (ref 22–32)
Calcium: 8.7 mg/dL — ABNORMAL LOW (ref 8.9–10.3)
Creatinine, Ser: 0.96 mg/dL (ref 0.44–1.00)
GFR calc Af Amer: >60 mL/min (ref >60)
GLUCOSE: 90 mg/dL (ref 65–99)
POTASSIUM: 3.5 mmol/L (ref 3.5–5.1)
Sodium: 136 mmol/L (ref 135–145)
Total Bilirubin: 0.6 mg/dL (ref 0.3–1.2)
Total Protein: 7.8 g/dL (ref 6.5–8.1)

## 2017-01-26 LAB — CBC
HEMATOCRIT: 35.9 % — AB (ref 36.0–46.0)
HEMOGLOBIN: 12.4 g/dL (ref 12.0–15.0)
MCH: 31.2 pg (ref 26.0–34.0)
MCHC: 34.5 g/dL (ref 30.0–36.0)
MCV: 90.4 fL (ref 78.0–100.0)
Platelets: 160 10*3/uL (ref 150–400)
RBC: 3.97 MIL/uL (ref 3.87–5.11)
RDW: 13.3 % (ref 11.5–15.5)
WBC: 6.2 10*3/uL (ref 4.0–10.5)

## 2017-01-26 LAB — LIPASE, BLOOD: LIPASE: 32 U/L (ref 11–51)

## 2017-01-26 MED ORDER — ACETAMINOPHEN 325 MG PO TABS
650.0000 mg | ORAL_TABLET | Freq: Once | ORAL | Status: AC
  Administered 2017-01-26: 650 mg via ORAL
  Filled 2017-01-26: qty 2

## 2017-01-26 MED ORDER — SODIUM CHLORIDE 0.9 % IV BOLUS (SEPSIS)
1000.0000 mL | Freq: Once | INTRAVENOUS | Status: AC
  Administered 2017-01-26: 1000 mL via INTRAVENOUS

## 2017-01-26 MED ORDER — DIPHENHYDRAMINE HCL 50 MG/ML IJ SOLN
25.0000 mg | Freq: Once | INTRAMUSCULAR | Status: AC
  Administered 2017-01-26: 25 mg via INTRAVENOUS
  Filled 2017-01-26: qty 1

## 2017-01-26 MED ORDER — ONDANSETRON 4 MG PO TBDP: 4.0000 mg | ORAL_TABLET | Freq: Three times a day (TID) | ORAL | 0 refills | Status: DC | PRN

## 2017-01-26 MED ORDER — OSELTAMIVIR PHOSPHATE 75 MG PO CAPS: 75.0000 mg | ORAL_CAPSULE | Freq: Two times a day (BID) | ORAL | 0 refills | Status: AC

## 2017-01-26 MED ORDER — PROCHLORPERAZINE EDISYLATE 5 MG/ML IJ SOLN
10.0000 mg | Freq: Once | INTRAMUSCULAR | Status: AC
  Administered 2017-01-26: 10 mg via INTRAVENOUS
  Filled 2017-01-26: qty 2

## 2017-01-26 NOTE — ED Notes (Signed)
Patient was alert, oriented and stable upon discharge. RN went over AVS and patient had no further questions.  

## 2017-01-26 NOTE — ED Triage Notes (Signed)
Patient states having nausea, diarrhea, coughing, weakness starting yesterday morning.  Patient has thrown up once and had diarrhea 10 times.  Patient states a dry cough.  Patient came in with at temperature of 100.3

## 2017-01-26 NOTE — ED Triage Notes (Signed)
Patient states having a headache starting this morning.  Patient has not taken any medication for the headache.

## 2017-01-26 NOTE — ED Provider Notes (Signed)
[facility]-EMERGENCY DEPT Provider Note   CSN: [id number] Arrival date & time: 01/26/17  1623     History   Chief Complaint Chief Complaint  Patient presents with  . Nausea  . Weakness  . Cough  . Shortness of Breath    HPI Michele Black is a 56 y.o. female.  HPI   Since yesterday, nausea, one episode of vomiting,  Diarrhea x 10-12 times. No black or bloody stools.  Hot/cold with chills. Temperature here 100.3.  Has also had cough and congestion, mild. No chest pain or shortness of breath.  No abdominal pain.  Daughter is also sick with same. Went to physician yesterday.  Past Medical History:  Diagnosis Date  . Acute myocardial infarction of other anterior wall, initial episode of care   . Anxiety   . Arthritis   . Ischemic cardiomyopathy   . Mixed hyperlipidemia   . Tobacco use disorder     Patient Active Problem List   Diagnosis Date Noted  . CAD (coronary artery disease) 08/08/2016  . 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 Mixed hyperlipidemia   . Ischemic cardiomyopathy   . Acute myocardial infarction of other anterior wall, initial episode of care   . Tobacco use disorder     Past Surgical History:  Procedure Laterality Date  . LEFT HEART CATHETERIZATION WITH CORONARY ANGIOGRAM Bilateral 09/05/2013   Procedure: LEFT HEART CATHETERIZATION WITH CORONARY ANGIOGRAM;  Surgeon: Corky Crafts, MD;  Location: [facility] CATH LAB;  Service: Cardiovascular;  Laterality: Bilateral;  . PERCUTANEOUS CORONARY STENT INTERVENTION (PCI-S)  09/05/2013   Procedure: PERCUTANEOUS CORONARY STENT INTERVENTION (PCI-S);  Surgeon: Corky Crafts, MD;  Location: [facility] CATH LAB;  Service: Cardiovascular;;  DES to mid LAD    OB History    No data available       Home Medications    Prior to Admission medications   Medication Sig Start Date End Date Taking? Authorizing Provider  ARIPiprazole (ABILIFY) 5 MG tablet Take 5 mg by mouth daily.   Yes Historical Provider, MD  aspirin 81 MG  chewable tablet Chew 1 tablet (81 mg total) by mouth daily. 10/09/13  Yes Richarda Overlie, MD  atorvastatin (LIPITOR) 40 MG tablet TAKE 1 TABLET BY MOUTH DAILY AT 6 PM. Patient taking differently: Take 40 mg by mouth daily at 6 PM.  08/08/16  Yes Corky Crafts, MD  clopidogrel (PLAVIX) 75 MG tablet TAKE 1 TABLET BY MOUTH DAILY. 08/24/16  Yes Corky Crafts, MD  furosemide (LASIX) 20 MG tablet TAKE 1 TABLET BY MOUTH 2 TIMES DAILY AS NEEDED FOR FLUID OR EDEMA (SHORTNESS OF BREATH). 08/24/16  Yes Corky Crafts, MD  gabapentin (NEURONTIN) 100 MG capsule Take 100 mg by mouth 3 (three) times daily.   Yes Historical Provider, MD  lisinopril (PRINIVIL,ZESTRIL) 5 MG tablet TAKE 1 TABLET BY MOUTH DAILY. 08/24/16  Yes Corky Crafts, MD  metoprolol tartrate (LOPRESSOR) 25 MG tablet Take 0.5 tablets (12.5 mg total) by mouth 2 (two) times daily. 08/24/16  Yes Corky Crafts, MD  nitroGLYCERIN (NITROSTAT) 0.4 MG SL tablet Place 1 tablet (0.4 mg total) under the tongue every 5 (five) minutes as needed for chest pain. 3 DOSES MAX 08/08/16  Yes Corky Crafts, MD  potassium chloride SA (KLOR-CON M20) 20 MEQ tablet Take 2 tablets (40 mEq total) by mouth daily. Patient taking differently: Take 40 mEq by mouth daily as needed (when taking Lasix).  08/08/16  Yes Corky Crafts, MD  sertraline (ZOLOFT)  100 MG tablet Take 100 mg by mouth daily.   Yes Historical Provider, MD  traZODone (DESYREL) 100 MG tablet Take 100 mg by mouth at bedtime.   Yes Historical Provider, MD  ondansetron (ZOFRAN ODT) 4 MG disintegrating tablet Take 1 tablet (4 mg total) by mouth every 8 (eight) hours as needed for nausea or vomiting. 01/26/17   Alvira Monday, MD  oseltamivir (TAMIFLU) 75 MG capsule Take 1 capsule (75 mg total) by mouth every 12 (twelve) hours. 01/26/17 01/31/17  Alvira Monday, MD    Family History Family History  Problem Relation Age of Onset  . Throat cancer Mother   . Heart attack Neg Hx      Social History Social History  Substance Use Topics  . Smoking status: Current Every Day Smoker    Packs/day: 1.00    Types: Cigarettes    Last attempt to quit: 09/05/2013  . Smokeless tobacco: Never Used  . Alcohol use 0.6 oz/week    1 Shots of liquor per week     Allergies   Patient has no known allergies.   Review of Systems Review of Systems  Constitutional: Positive for appetite change, chills, fatigue and fever.  HENT: Positive for congestion.   Respiratory: Positive for cough. Negative for shortness of breath.   Cardiovascular: Negative for chest pain.  Gastrointestinal: Positive for diarrhea, nausea and vomiting. Negative for abdominal pain.  Genitourinary: Negative for difficulty urinating and dysuria.  Musculoskeletal: Positive for myalgias.  Skin: Negative for rash.     Physical Exam Updated Vital Signs BP 109/60   Pulse 69   Temp 100.3 F (37.9 C) (Oral)   Resp 12   Ht 5\' 2"  (1.575 m)   Wt 154 lb (69.9 kg)   SpO2 92%   BMI 28.17 kg/m   Physical Exam  Constitutional: She is oriented to person, place, and time. She appears well-developed and well-nourished. No distress.  HENT:  Head: Normocephalic and atraumatic.  Eyes: Conjunctivae and EOM are normal.  Neck: Normal range of motion.  Cardiovascular: Normal rate, regular rhythm, normal heart sounds and intact distal pulses.  Exam reveals no gallop and no friction rub.   No murmur heard. Pulmonary/Chest: Effort normal and breath sounds normal. No respiratory distress. She has no wheezes. She has no rales.  Abdominal: Soft. She exhibits no distension. There is no tenderness. There is no guarding.  Musculoskeletal: She exhibits no edema or tenderness.  Neurological: She is alert and oriented to person, place, and time.  Skin: Skin is warm and dry. No rash noted. She is not diaphoretic. No erythema.  Nursing note and vitals reviewed.    ED Treatments / Results  Labs (all labs ordered are  listed, but only abnormal results are displayed) Labs Reviewed  COMPREHENSIVE METABOLIC PANEL - Abnormal; Notable for the following:       Result Value   Calcium 8.7 (*)    All other components within normal limits  CBC - Abnormal; Notable for the following:    HCT 35.9 (*)    All other components within normal limits  URINALYSIS, ROUTINE W REFLEX MICROSCOPIC - Abnormal; Notable for the following:    Hgb urine dipstick MODERATE (*)    Leukocytes, UA TRACE (*)    Bacteria, UA RARE (*)    Squamous Epithelial / LPF 0-5 (*)    All other components within normal limits  LIPASE, BLOOD    EKG  EKG Interpretation  Date/Time:  Thursday January 26 2017 18:21:18 EST  Ventricular Rate:  58 PR Interval:    QRS Duration: 87 QT Interval:  399 QTC Calculation: 392 R Axis:   58 Text Interpretation:  Sinus rhythm Probable left atrial enlargement Anterolateral infarct, age indeterminate No significant change since last tracing Confirmed by [facility] MD, Alissa Pharr ([id number]) on 01/26/2017 7:51:59 PM       Radiology Dg Chest 2 View  Result Date: 01/26/2017 CLINICAL DATA:  Shortness of breath, nausea, diarrhea, cough, weakness beginning yesterday. EXAM: CHEST  2 VIEW COMPARISON:  Chest radiograph September 04, 2013 FINDINGS: Cardiomediastinal silhouette is normal. No pleural effusions or focal consolidations. Trachea projects midline and there is no pneumothorax. Soft tissue planes and included osseous structures are non-suspicious. IMPRESSION: Normal chest. Electronically Signed   By: Awilda Metro M.D.   On: 01/26/2017 18:47    Procedures Procedures (including critical care time)  Medications Ordered in ED Medications  sodium chloride 0.9 % bolus 1,000 mL (0 mLs Intravenous Stopped 01/26/17 2233)  sodium chloride 0.9 % bolus 1,000 mL (0 mLs Intravenous Stopped 01/26/17 2233)  acetaminophen (TYLENOL) tablet 650 mg (650 mg Oral Given 01/26/17 2116)  prochlorperazine (COMPAZINE) injection 10 mg (10 mg  Intravenous Given 01/26/17 2117)  diphenhydrAMINE (BENADRYL) injection 25 mg (25 mg Intravenous Given 01/26/17 2117)     Initial Impression / Assessment and Plan / ED Course  I have reviewed the triage vital signs and the nursing notes.  Pertinent labs & imaging results that were available during my care of the patient were reviewed by me and considered in my medical decision making (see chart for details).     56 year old female with history of hlpd, CAD, CHF presents with concern for nausea, vomiting, diarrhea, cough, congestion, mild body aches.  No sign of UTI. Abdominal exam benign and doubt acute surgical pathology. No sign of pneumonia.  Normal movement of neck and doubt meningitis.  Doubt bacterial infection. WBC normal and labs WNL. Suspect influenza given hx, sick contacts.   Given normal saline, compazine and benadryl with resolution of headache and nausea. Pt states improvement. Patient discharged in stable condition with understanding of reasons to return.   Final Clinical Impressions(s) / ED Diagnoses   Final diagnoses:  Influenza-like illness  Nausea vomiting and diarrhea    New Prescriptions Discharge Medication List as of 01/26/2017 10:19 PM    START taking these medications   Details  ondansetron (ZOFRAN ODT) 4 MG disintegrating tablet Take 1 tablet (4 mg total) by mouth every 8 (eight) hours as needed for nausea or vomiting., Starting Thu 01/26/2017, Print    oseltamivir (TAMIFLU) 75 MG capsule Take 1 capsule (75 mg total) by mouth every 12 (twelve) hours., Starting Thu 01/26/2017, Until Tue 01/31/2017, Print         Alvira Monday, MD 01/27/17 [id number]

## 2017-01-30 MED FILL — ?LISINOPRIL 5 MG TABLET: 5 | 30 days supply | Qty: 30 | Fill #3

## 2017-01-30 MED FILL — ?FUROSEMIDE 20 MG TABLET: 20 | 15 days supply | Qty: 30 | Fill #2

## 2017-01-30 MED FILL — METOPROLOL TARTRATE 25 MG T: 25 | 30 days supply | Qty: 30 | Fill #3

## 2017-01-30 MED FILL — ?ATORVASTATIN 40MG TABLET: 40 | 30 days supply | Qty: 30 | Fill #3

## 2017-01-30 MED FILL — CLOPIDOGREL 75 MG TABLET: 75 | 30 days supply | Qty: 30 | Fill #3

## 2017-01-31 ENCOUNTER — Ambulatory Visit: Payer: No Typology Code available for payment source | Admitting: Interventional Cardiology

## 2017-02-06 ENCOUNTER — Encounter: Payer: Self-pay | Admitting: *Deleted

## 2017-02-16 ENCOUNTER — Ambulatory Visit: Payer: No Typology Code available for payment source | Admitting: Cardiology

## 2017-02-21 ENCOUNTER — Encounter: Payer: Self-pay | Admitting: Cardiology

## 2017-03-14 ENCOUNTER — Ambulatory Visit (INDEPENDENT_AMBULATORY_CARE_PROVIDER_SITE_OTHER): Payer: No Typology Code available for payment source | Admitting: Interventional Cardiology

## 2017-03-14 ENCOUNTER — Encounter: Payer: Self-pay | Admitting: Interventional Cardiology

## 2017-03-14 ENCOUNTER — Encounter (INDEPENDENT_AMBULATORY_CARE_PROVIDER_SITE_OTHER): Payer: Self-pay

## 2017-03-14 VITALS — BP 96/60 | HR 56 | Ht 62.0 in | Wt 157.6 lb

## 2017-03-14 DIAGNOSIS — I252 Old myocardial infarction: Secondary | ICD-10-CM

## 2017-03-14 DIAGNOSIS — F17200 Nicotine dependence, unspecified, uncomplicated: Secondary | ICD-10-CM

## 2017-03-14 DIAGNOSIS — I25119 Atherosclerotic heart disease of native coronary artery with unspecified angina pectoris: Secondary | ICD-10-CM

## 2017-03-14 DIAGNOSIS — E782 Mixed hyperlipidemia: Secondary | ICD-10-CM

## 2017-03-14 MED ORDER — NICOTINE 21 MG/24HR TD PT24: 21.0000 mg | MEDICATED_PATCH | Freq: Every day | TRANSDERMAL | 0 refills | Status: DC

## 2017-03-14 NOTE — Progress Notes (Signed)
Cardiology Office Note   Date:  03/14/2017   ID:  Michele Black, DOB Nov 04, 1961, MRN [id number]  PCP:  No PCP Per Patient    No chief complaint on file. f/u CAD   Wt Readings from Last 3 Encounters:  03/14/17 157 lb 9.6 oz (71.5 kg)  01/26/17 154 lb (69.9 kg)  08/08/16 137 lb (62.1 kg)       History of Present Illness: Michele Black is a 56 y.o. female  who had an anterior MI in 10/14.  She presented late but had an LAD stent placed.  No Cp, no SHOB.  No further lightheadedness with standing or bending down and standing up.   No symptoms like what she had with her heart attack. She has had some anxiety issues since her heart attack, but this has improved with medication.  She goes to a behavioral health.  THat is the only other MD that she sees.   No syncope, sweating, nausea or vomiting. No lower extremity swelling. No trouble lying flat.  Occasional knee pain from arthritis, but this has improved as well.  She walks every other day for about 30 minutes.   She is now able to get her current meds without any problems.  No bleeding problems.   She walks a lot at work.  No NTG use.  Walks her dog as well.    Overall, she feels she is doing well.  She works for [employer].  She is still smoking. She is willing to try a patch.      Past Medical History:  Diagnosis Date  . Acute myocardial infarction of other anterior wall, initial episode of care   . Anxiety   . Arthritis   . Ischemic cardiomyopathy   . Mixed hyperlipidemia   . Tobacco use disorder     Past Surgical History:  Procedure Laterality Date  . LEFT HEART CATHETERIZATION WITH CORONARY ANGIOGRAM Bilateral 09/05/2013   Procedure: LEFT HEART CATHETERIZATION WITH CORONARY ANGIOGRAM;  Surgeon: Corky Crafts, MD;  Location: [facility] CATH LAB;  Service: Cardiovascular;  Laterality: Bilateral;  . PERCUTANEOUS CORONARY STENT INTERVENTION (PCI-S)  09/05/2013   Procedure: PERCUTANEOUS CORONARY STENT  INTERVENTION (PCI-S);  Surgeon: Corky Crafts, MD;  Location: [facility] CATH LAB;  Service: Cardiovascular;;  DES to mid LAD     Current Outpatient Prescriptions  Medication Sig Dispense Refill  . ARIPiprazole (ABILIFY) 5 MG tablet Take 5 mg by mouth daily.    Marland Kitchen aspirin 81 MG chewable tablet Chew 1 tablet (81 mg total) by mouth daily.    Marland Kitchen atorvastatin (LIPITOR) 40 MG tablet Take 40 mg by mouth daily at 6 PM.    . clopidogrel (PLAVIX) 75 MG tablet Take 75 mg by mouth daily.    . furosemide (LASIX) 20 MG tablet TAKE 1 TABLET BY MOUTH 2 TIMES DAILY AS NEEDED FOR FLUID OR EDEMA (SHORTNESS OF BREATH). 30 tablet 3  . gabapentin (NEURONTIN) 100 MG capsule Take 100 mg by mouth 3 (three) times daily.    Marland Kitchen lisinopril (PRINIVIL,ZESTRIL) 5 MG tablet TAKE 1 TABLET BY MOUTH DAILY. 30 tablet 11  . metoprolol tartrate (LOPRESSOR) 25 MG tablet Take 0.5 tablets (12.5 mg total) by mouth 2 (two) times daily. 30 tablet 11  . nitroGLYCERIN (NITROSTAT) 0.4 MG SL tablet Place 1 tablet (0.4 mg total) under the tongue every 5 (five) minutes as needed for chest pain. 3 DOSES MAX 25 tablet 3  . ondansetron (ZOFRAN ODT) 4 MG disintegrating  tablet Take 1 tablet (4 mg total) by mouth every 8 (eight) hours as needed for nausea or vomiting. 20 tablet 0  . potassium chloride SA (KLOR-CON M20) 20 MEQ tablet Take 2 tablets (40 mEq total) by mouth daily. (Patient taking differently: Take 40 mEq by mouth daily as needed (when taking Lasix). ) 90 tablet 1  . sertraline (ZOLOFT) 100 MG tablet Take 100 mg by mouth daily.    . traZODone (DESYREL) 100 MG tablet Take 100 mg by mouth at bedtime.     No current facility-administered medications for this visit.     Allergies:   Patient has no known allergies.    Social History:  The patient  reports that she has been smoking Cigarettes.  She has been smoking about 1.00 pack per day. She has never used smokeless tobacco. She reports that she drinks about 0.6 oz of alcohol per week . She  reports that she does not use drugs.   Family History:  The patient's family history includes Throat cancer in her mother.    ROS:  Please see the history of present illness.   Otherwise, review of systems are positive for unable to stop smoking; knee pain.   All other systems are reviewed and negative.    PHYSICAL EXAM: VS:  BP 96/60 (BP Location: Left Arm, Patient Position: Sitting, Cuff Size: Normal)   Pulse (!) 56   Ht 5\' 2"  (1.575 m)   Wt 157 lb 9.6 oz (71.5 kg)   SpO2 97%   BMI 28.83 kg/m  , BMI Body mass index is 28.83 kg/m. GEN: Well nourished, well developed, in no acute distress  HEENT: normal  Neck: no JVD, carotid bruits, or masses Cardiac: RRR; no murmurs, rubs, or gallops,no edema  Respiratory:  clear to auscultation bilaterally, normal work of breathing; no wheezing GI: soft, nontender, nondistended, + BS MS: no deformity or atrophy  Skin: warm and dry, no rash; 2+ right radial pulse Neuro:  Strength and sensation are intact Psych: euthymic mood, full affect   EKG:   The ekg ordered today demonstrates sinus bradycardia, prior anterior MI, no ST segment changes   Recent Labs: 01/26/2017: ALT 19; BUN 11; Creatinine, Ser 0.96; Hemoglobin 12.4; Platelets 160; Potassium 3.5; Sodium 136   Lipid Panel    Component Value Date/Time   CHOL 131 08/08/2016 1024   TRIG 68 08/08/2016 1024   HDL 38 (L) 08/08/2016 1024   CHOLHDL 3.4 08/08/2016 1024   VLDL 14 08/08/2016 1024   LDLCALC 79 08/08/2016 1024     Other studies Reviewed: Additional studies/ records that were reviewed today with results demonstrating: Nl LV function in April 2015.   ASSESSMENT AND PLAN:  1. CAD/old anterior MI/cardiomyopathy: No angina.  LV function has returned to normal by 4/15 echo.  No longer needing to take furosemide, and no issues with swelling.  Ejection fraction was 30% at the time of the catheterization. No residual systolic dysfunction by most recent echo in 2015, as noted above.   No sign of CHF.  2. Smoking: She has cut back but not stopped completely.  She is willing to try nicotine patches. Will prescribe 21 mg patches with refills.  She did not fill this Rx The last time we prescribed this.  Well try again.  3. Hyperlipidemia: LDL controlled in 9/17. 4. Decreased metoprolol to 12.5 mg BID due to bradycardia.  No sx of hypotension.  Continue ACE-I for now as well to reduce risk. 5. F/u in  4 months.  Labs at the next visit.   Current medicines are reviewed at length with the patient today.  The patient concerns regarding her medicines were addressed.  The following changes have been made:  Nicotine patch 21 mg; if doinfg well at the next visit, could decrease to 14 mg /patch.    Labs/ tests ordered today include:  No orders of the defined types were placed in this encounter.   Recommend 150 minutes/week of aerobic exercise Low fat, low carb, high fiber diet recommended  Disposition:   FU in 4 months; no PMD at this time   Signed, Lance Muss, MD  03/14/2017 4:23 PM    [facility] [address] [place], [place], [place]  [address] Phone: [phone]; Fax: [phone]

## 2017-03-14 NOTE — Patient Instructions (Addendum)
Medication Instructions:  Your physician recommends that you continue on your current medications as directed. Please refer to the Current Medication list given to you today.  START nicotine patches 21mg  patch daily.   Labwork: CMET and FASTING LIPIDS in 4 months with OFFICE VISIT  Testing/Procedures: None ordered  Follow-Up: Your physician recommends that you schedule a follow-up appointment in: 4 months with Dr. Eldridge Dace    Any Other Special Instructions Will Be Listed Below (If Applicable).     If you need a refill on your cardiac medications before your next appointment, please call your pharmacy.

## 2017-03-29 MED FILL — CLOPIDOGREL 75 MG TABLET: 75 | 30 days supply | Qty: 30 | Fill #4

## 2017-03-29 MED FILL — ATORVASTATIN 40 MG TABLET: 40 | 30 days supply | Qty: 30 | Fill #4

## 2017-03-29 MED FILL — LISINOPRIL 5 MG TABLET: 5 | 30 days supply | Qty: 30 | Fill #4

## 2017-03-29 MED FILL — METOPROLOL TARTRATE 25 MG T: 25 | 30 days supply | Qty: 30 | Fill #4

## 2017-03-29 MED FILL — FUROSEMIDE 20 MG TABLET: 20 | 15 days supply | Qty: 30 | Fill #3

## 2017-05-25 MED FILL — ?ATORVASTATIN 40MG TABLET: 40 | 30 days supply | Qty: 30 | Fill #5

## 2017-05-25 MED FILL — ?FUROSEMIDE 20 MG TABLET: 20 | 45 days supply | Qty: 90 | Fill #0

## 2017-05-25 MED FILL — LISINOPRIL 5 MG TAB: 5 | 30 days supply | Qty: 30 | Fill #5

## 2017-05-25 MED FILL — ?METOPROLOL 25 MG TABLET: 25 | 30 days supply | Qty: 30 | Fill #5

## 2017-05-25 MED FILL — ?CLOPIDOGREL 75 MG TABLET: 75 | 30 days supply | Qty: 30 | Fill #5

## 2017-07-10 ENCOUNTER — Other Ambulatory Visit: Payer: No Typology Code available for payment source

## 2017-07-10 ENCOUNTER — Ambulatory Visit: Payer: No Typology Code available for payment source | Admitting: Interventional Cardiology

## 2017-07-11 ENCOUNTER — Encounter: Payer: Self-pay | Admitting: Interventional Cardiology

## 2017-07-26 ENCOUNTER — Other Ambulatory Visit: Payer: Self-pay | Admitting: Interventional Cardiology

## 2017-07-26 MED FILL — ?ATORVASTATIN 40MG TABLET: 40 | 30 days supply | Qty: 30 | Fill #6

## 2017-07-26 MED FILL — LISINOPRIL 5 MG TAB: 5 | 30 days supply | Qty: 30 | Fill #6

## 2017-07-26 MED FILL — ?FUROSEMIDE 20 MG TABLET: 20 | 45 days supply | Qty: 90 | Fill #1

## 2017-07-26 MED FILL — ?METOPROLOL 25 MG TABLET: 25 | 30 days supply | Qty: 30 | Fill #6

## 2017-07-27 MED FILL — ?CLOPIDOGREL 75MG TAB: 75 | 30 days supply | Qty: 30 | Fill #0

## 2017-10-06 ENCOUNTER — Other Ambulatory Visit: Payer: Self-pay | Admitting: Interventional Cardiology

## 2017-10-31 ENCOUNTER — Ambulatory Visit: Payer: No Typology Code available for payment source | Admitting: Interventional Cardiology

## 2017-11-02 ENCOUNTER — Ambulatory Visit: Payer: No Typology Code available for payment source | Admitting: Interventional Cardiology

## 2017-11-03 ENCOUNTER — Encounter: Payer: Self-pay | Admitting: Interventional Cardiology

## 2017-12-25 MED FILL — LISINOPRIL 5 MG TABLET: 5 | 30 days supply | Qty: 30 | Fill #0

## 2017-12-25 MED FILL — ?METOPROLOL 25 MG TABLET: 25 | 30 days supply | Qty: 30 | Fill #0

## 2017-12-25 MED FILL — ?CLOPIDOGREL 75MG TABLET: 75 | 30 days supply | Qty: 30 | Fill #1

## 2017-12-25 MED FILL — ?ATORVASTATIN 40MG TABLET: 40 | 30 days supply | Qty: 30 | Fill #0

## 2017-12-25 MED FILL — ?FUROSEMIDE 20 MG TABLET: 20 | 30 days supply | Qty: 60 | Fill #0

## 2018-02-07 MED FILL — ATORVASTATIN 40 MG TABLET: 40 | 30 days supply | Qty: 30 | Fill #1

## 2018-02-07 MED FILL — METOPROLOL TARTRATE 25 MG T: 25 | 30 days supply | Qty: 30 | Fill #1

## 2018-02-07 MED FILL — LISINOPRIL 5 MG TAB: 5 | 30 days supply | Qty: 30 | Fill #1

## 2018-02-07 MED FILL — FUROSEMIDE 20 MG TABLET: 20 | 30 days supply | Qty: 60 | Fill #1

## 2018-02-07 MED FILL — CLOPIDOGREL 75 MG TABLET: 75 | 30 days supply | Qty: 30 | Fill #2

## 2018-03-11 IMAGING — CR DG CHEST 2V
2 series · 2 of 2 positions shown · non-contrast
Comparison: Chest radiograph September 04, 2013

CLINICAL DATA: Shortness of breath, nausea, diarrhea, cough,
weakness beginning yesterday.

EXAM:
CHEST  2 VIEW

[w chest pa]
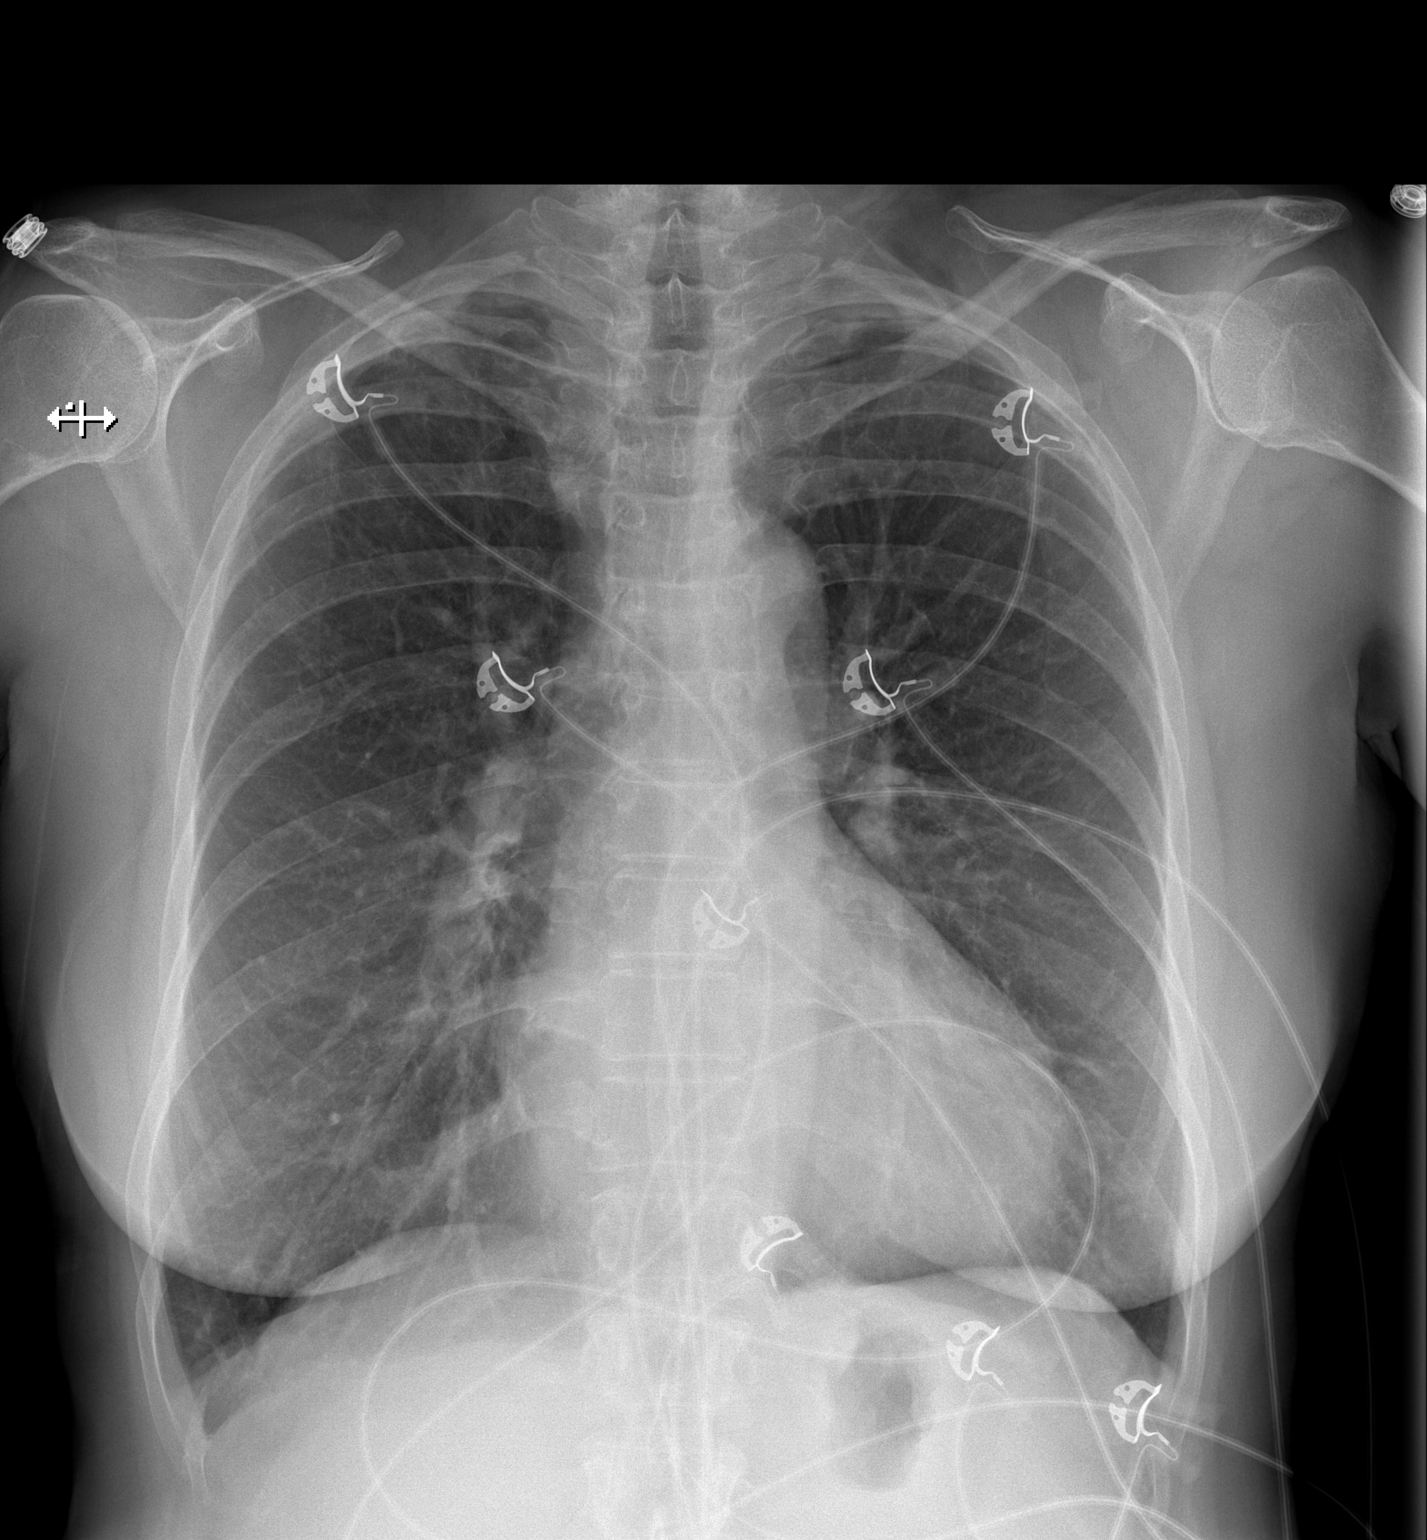

[w chest lat]
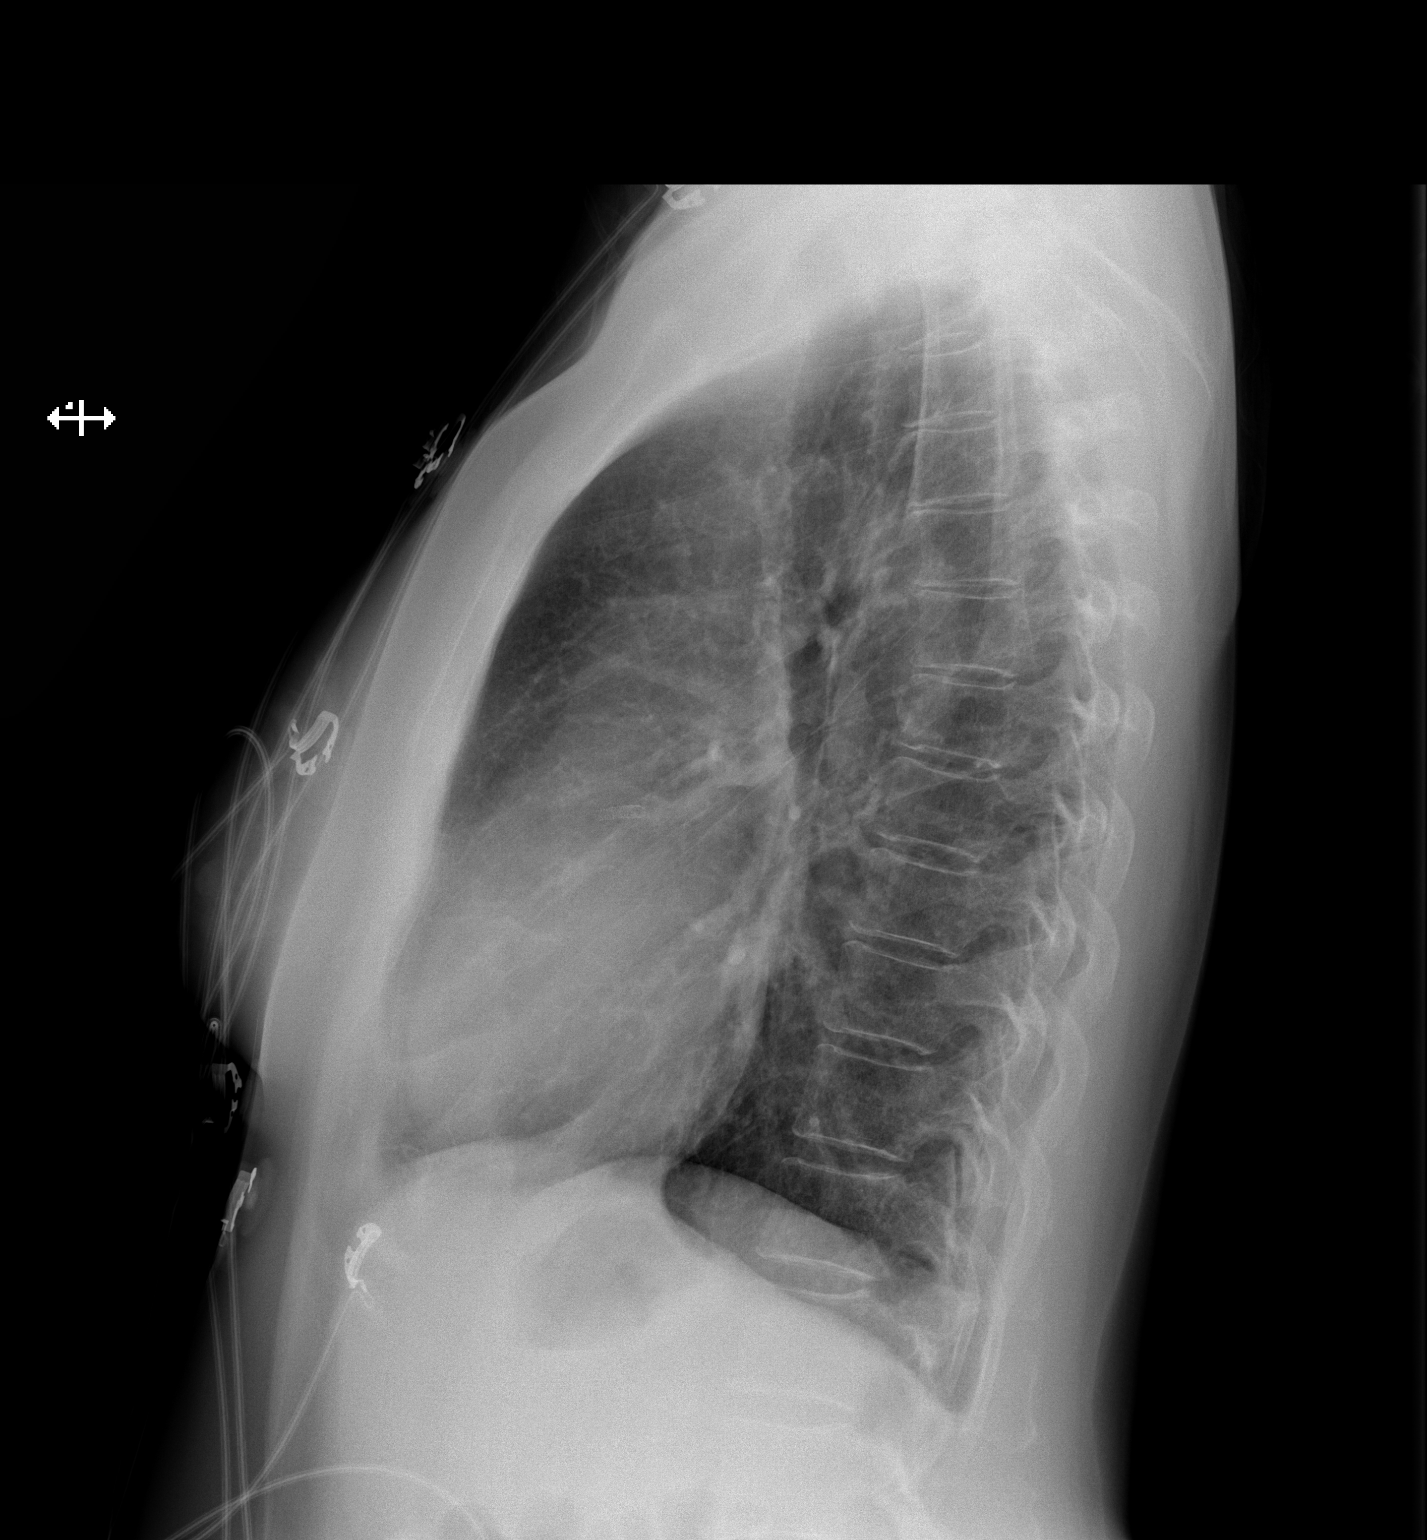

[2 of 2 positions shown; findings below may reference images not displayed]

FINDINGS: Cardiomediastinal silhouette is normal. No pleural effusions or
focal consolidations. Trachea projects midline and there is no
pneumothorax. Soft tissue planes and included osseous structures are
non-suspicious.
IMPRESSION: Normal chest.

## 2020-07-22 ENCOUNTER — Encounter (HOSPITAL_COMMUNITY): Payer: Self-pay | Admitting: Psychiatry

## 2020-07-22 ENCOUNTER — Ambulatory Visit (INDEPENDENT_AMBULATORY_CARE_PROVIDER_SITE_OTHER): Payer: No Payment, Other | Admitting: Psychiatry

## 2020-07-22 ENCOUNTER — Other Ambulatory Visit: Payer: Self-pay

## 2020-07-22 DIAGNOSIS — F418 Other specified anxiety disorders: Secondary | ICD-10-CM | POA: Insufficient documentation

## 2020-07-22 MED ORDER — SERTRALINE HCL 100 MG PO TABS: 100.0000 mg | ORAL_TABLET | Freq: Every day | ORAL | 2 refills | Status: DC

## 2020-07-22 MED ORDER — CLONAZEPAM 0.5 MG PO TABS: 0.5000 mg | ORAL_TABLET | Freq: Two times a day (BID) | ORAL | 2 refills | Status: DC

## 2020-07-22 MED ORDER — ARIPIPRAZOLE 5 MG PO TABS: 5.0000 mg | ORAL_TABLET | Freq: Every day | ORAL | 1 refills | Status: DC

## 2020-07-22 MED ORDER — TRAZODONE HCL 100 MG PO TABS: 100.0000 mg | ORAL_TABLET | Freq: Every day | ORAL | 2 refills | Status: DC

## 2020-07-22 NOTE — Progress Notes (Signed)
Psychiatric Initial Adult Assessment    Patient Identification: Michele Black MRN:  [id number] Date of Evaluation:  07/22/2020   Referral Source: Vesta Mixer  Chief Complaint:  " I have lot of anxiety about getting in to a vehicle with others."    Visit Diagnosis:    ICD-10-CM   1. Depression with anxiety  F41.8     History of Present Illness: This is a 59 year old female with history of MDD and anxiety now seen for establishing care.  She was being managed at [facility] on trazodone 100 mg at bedtime, Abilify 5 mg daily, sertraline 100 mg daily, hydroxyzine 25 mg 3 times daily. 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  She stated that overall her medications are helpful in her mood and anxiety are stable however she continues to feel very anxious and panicky whenever she has to get in the car with someone.  She stated that she has to be at work 5 days a week and she has never driven in her lifetime.  She does not drive.  She stated that her son is the 1 who picks her up and dropped her off at work.  She feels panicky whenever she has to get in the car with him and also with anyone else. She stated that her heart starts racing very fast and she has to clench her fists and she has to keep her eyes closed during her car ride.  She worries about getting hit by a different vehicle. She does not find hydroxyzine to be helpful at all.  She used to take gabapentin the past and stopped taking it because it was not helping. She informed that she has been clonazepam in the past and found to be beneficial. She denies any excessive consumption of alcohol.  She denied any illicit use of drugs.  She denied any ongoing symptoms of depression like sadness, crying spells, low energy levels.  She denied any suicidal or homicidal ideations.  She denied any symptom suggestive of mania or psychosis.  PDMP was reviewed.   Past Psychiatric History: MDD, anxiety  Previous Psychotropic Medications:  Yes   Substance Abuse History in the last 12 months:  No.  Consequences of Substance Abuse: NA  Past Medical History:  Past Medical History:  Diagnosis Date  . Acute myocardial infarction of other anterior wall, initial episode of care   . Anxiety   . Arthritis   . Ischemic cardiomyopathy   . Mixed hyperlipidemia   . Tobacco use disorder     Past Surgical History:  Procedure Laterality Date  . LEFT HEART CATHETERIZATION WITH CORONARY ANGIOGRAM Bilateral 09/05/2013   Procedure: LEFT HEART CATHETERIZATION WITH CORONARY ANGIOGRAM;  Surgeon: Corky Crafts, MD;  Location: [facility] CATH LAB;  Service: Cardiovascular;  Laterality: Bilateral;  . PERCUTANEOUS CORONARY STENT INTERVENTION (PCI-S)  09/05/2013   Procedure: PERCUTANEOUS CORONARY STENT INTERVENTION (PCI-S);  Surgeon: Corky Crafts, MD;  Location: [facility] CATH LAB;  Service: Cardiovascular;;  DES to mid LAD    Family Psychiatric History: anxiety- mom  Family History:  Family History  Problem Relation Age of Onset  . Throat cancer Mother   . Heart attack Neg Hx     Social History:   Social History   Socioeconomic History  . Marital status: Divorced    Spouse name: Not on file  . Number of children: Not on file  . Years of education: Not on file  . Highest education level: Not on file  Occupational History  . Not on file  Tobacco Use  . Smoking status: Current Every Day Smoker    Packs/day: 1.00    Types: Cigarettes    Last attempt to quit: 09/05/2013    Years since quitting: 6.8  . Smokeless tobacco: Never Used  Substance and Sexual Activity  . Alcohol use: Yes    Alcohol/week: 1.0 standard drink    Types: 1 Shots of liquor per week  . Drug use: No  . Sexual activity: Not on file  Other Topics Concern  . Not on file  Social History Narrative  . Not on file   Social Determinants of Health   Financial Resource Strain:   . Difficulty of Paying Living Expenses: Not on file  Food Insecurity:   . Worried  About Programme researcher, broadcasting/film/video in the Last Year: Not on file  . Ran Out of Food in the Last Year: Not on file  Transportation Needs:   . Lack of Transportation (Medical): Not on file  . Lack of Transportation (Non-Medical): Not on file  Physical Activity:   . Days of Exercise per Week: Not on file  . Minutes of Exercise per Session: Not on file  Stress:   . Feeling of Stress : Not on file  Social Connections:   . Frequency of Communication with Friends and Family: Not on file  . Frequency of Social Gatherings with Friends and Family: Not on file  . Attends Religious Services: Not on file  . Active Member of Clubs or Organizations: Not on file  . Attends Banker Meetings: Not on file  . Marital Status: Not on file    Additional Social History: Lives with her son his fiance and grandson.  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  She informed that she has total of 11 grandchildren.  Allergies:  No Known Allergies  Metabolic Disorder Labs: No results found for: HGBA1C, MPG No results found for: PROLACTIN Lab Results  Component Value Date   CHOL 131 08/08/2016   TRIG 68 08/08/2016   HDL 38 (L) 08/08/2016   CHOLHDL 3.4 08/08/2016   VLDL 14 08/08/2016   LDLCALC 79 08/08/2016   LDLCALC 83 02/19/2014   No results found for: TSH  Therapeutic Level Labs: No results found for: LITHIUM No results found for: CBMZ No results found for: VALPROATE  Current Medications: Current Outpatient Medications  Medication Sig Dispense Refill  . ARIPiprazole (ABILIFY) 5 MG tablet Take 5 mg by mouth daily.    Marland Kitchen aspirin 81 MG chewable tablet Chew 1 tablet (81 mg total) by mouth daily.    Marland Kitchen atorvastatin (LIPITOR) 40 MG tablet TAKE 1 TABLET BY MOUTH DAILY AT 6 PM. 30 tablet 2  . clopidogrel (PLAVIX) 75 MG tablet Take 75 mg by mouth daily.    . clopidogrel (PLAVIX) 75 MG tablet TAKE 1 TABLET BY MOUTH DAILY. 90 tablet  1  . furosemide (LASIX) 20 MG tablet TAKE 1 TABLET BY MOUTH 2 TIMES DAILY AS NEEDED FOR FLUID OR EDEMA (SHORTNESS OF BREATH). 60 tablet 2  . gabapentin (NEURONTIN) 100 MG capsule Take 100 mg by mouth 3 (three) times daily.    Marland Kitchen lisinopril (PRINIVIL,ZESTRIL) 5 MG tablet TAKE 1 TABLET BY MOUTH DAILY. 30 tablet 2  . metoprolol tartrate (LOPRESSOR) 25 MG tablet TAKE 1/2 TABLET BY MOUTH 2 TIMES DAILY. 30 tablet 2  .  nicotine (NICODERM CQ) 21 mg/24hr patch Place 1 patch (21 mg total) onto the skin daily. 28 patch 0  . nitroGLYCERIN (NITROSTAT) 0.4 MG SL tablet Place 1 tablet (0.4 mg total) under the tongue every 5 (five) minutes as needed for chest pain. 3 DOSES MAX 25 tablet 3  . ondansetron (ZOFRAN ODT) 4 MG disintegrating tablet Take 1 tablet (4 mg total) by mouth every 8 (eight) hours as needed for nausea or vomiting. 20 tablet 0  . potassium chloride SA (KLOR-CON M20) 20 MEQ tablet Take 2 tablets (40 mEq total) by mouth daily. (Patient taking differently: Take 40 mEq by mouth daily as needed (when taking Lasix). ) 90 tablet 1  . sertraline (ZOLOFT) 100 MG tablet Take 100 mg by mouth daily.    . traZODone (DESYREL) 100 MG tablet Take 100 mg by mouth at bedtime.     No current facility-administered medications for this visit.    Musculoskeletal: Strength & Muscle Tone: within normal limits Gait & Station: normal Patient leans: N/A  Psychiatric Specialty Exam: Review of Systems  There were no vitals taken for this visit.There is no height or weight on file to calculate BMI.  General Appearance: Fairly Groomed  Eye Contact:  Good  Speech:  Clear and Coherent and Normal Rate  Volume:  Normal  Mood:  Anxious  Affect:  Congruent  Thought Process:  Goal Directed and Descriptions of Associations: Intact  Orientation:  Full (Time, Place, and Person)  Thought Content:  Logical  Suicidal Thoughts:  No  Homicidal Thoughts:  No  Memory:  Immediate;   Good Recent;   Good  Judgement:  Fair   Insight:  Fair  Psychomotor Activity:  Normal  Concentration:  Concentration: Good and Attention Span: Good  Recall:  Good  Fund of Knowledge:Good  Language: Good  Akathisia:  Negative  Handed:  Right  AIMS (if indicated):  0  Assets:  Communication Skills Desire for Improvement Financial Resources/Insurance Housing Physical Health Social Support  ADL's:  Intact  Cognition: WNL  Sleep:  Good      Assessment and Plan: Based on patient's history and low risk for substance abuse patient was prescribed clonazepam to help her with the anxiety related to her riding a vehicle. Potential side effects of medication and risks vs benefits of treatment vs non-treatment were explained and discussed. All questions were answered. We will continue the remaining regimen.  1. Depression with anxiety  - ARIPiprazole (ABILIFY) 5 MG tablet; Take 1 tablet (5 mg total) by mouth daily.  Dispense: 30 tablet; Refill: 1 - traZODone (DESYREL) 100 MG tablet; Take 1 tablet (100 mg total) by mouth at bedtime.  Dispense: 30 tablet; Refill: 2 - sertraline (ZOLOFT) 100 MG tablet; Take 1 tablet (100 mg total) by mouth daily.  Dispense: 30 tablet; Refill: 2 - Start clonazePAM (KLONOPIN) 0.5 MG tablet; Take 1 tablet (0.5 mg total) by mouth 2 (two) times daily.  Dispense: 60 tablet; Refill: 2  F/up in 2 months.   Zena Amos, MD 9/1/20213:48 PM

## 2020-09-21 ENCOUNTER — Ambulatory Visit (HOSPITAL_COMMUNITY): Payer: No Payment, Other | Admitting: Psychiatry

## 2020-10-23 ENCOUNTER — Ambulatory Visit (HOSPITAL_COMMUNITY): Payer: No Payment, Other | Admitting: Psychiatry

## 2020-11-27 ENCOUNTER — Telehealth (HOSPITAL_COMMUNITY): Payer: Self-pay | Admitting: *Deleted

## 2020-11-27 DIAGNOSIS — F418 Other specified anxiety disorders: Secondary | ICD-10-CM

## 2020-11-27 MED ORDER — CLONAZEPAM 0.5 MG PO TABS: 0.5000 mg | ORAL_TABLET | Freq: Two times a day (BID) | ORAL | 0 refills | Status: DC

## 2020-11-27 NOTE — Addendum Note (Signed)
Addended by: Zena Amos on: 11/27/2020 08:37 AM   Modules accepted: Orders

## 2020-11-27 NOTE — Telephone Encounter (Signed)
Rx for 30 days sent

## 2020-11-27 NOTE — Telephone Encounter (Signed)
[place] sent over a request for patients Clonazepam to be refilled. Per [place] she last filled it on 10/28/20. Will bring need to Dr Magdalen Spatz attention.

## 2020-12-04 ENCOUNTER — Other Ambulatory Visit (HOSPITAL_COMMUNITY): Payer: Self-pay | Admitting: Psychiatry

## 2020-12-04 ENCOUNTER — Telehealth (HOSPITAL_COMMUNITY): Payer: Self-pay | Admitting: *Deleted

## 2020-12-04 DIAGNOSIS — F418 Other specified anxiety disorders: Secondary | ICD-10-CM

## 2020-12-04 MED ORDER — ARIPIPRAZOLE 5 MG PO TABS: 5.0000 mg | ORAL_TABLET | Freq: Every day | ORAL | 0 refills | Status: DC

## 2020-12-04 MED ORDER — SERTRALINE HCL 100 MG PO TABS: 100.0000 mg | ORAL_TABLET | Freq: Every day | ORAL | 0 refills | Status: DC

## 2020-12-04 MED ORDER — TRAZODONE HCL 100 MG PO TABS: 100.0000 mg | ORAL_TABLET | Freq: Every day | ORAL | 0 refills | Status: DC

## 2020-12-04 MED FILL — ARIPiprazole 5 MG TABS: 5 | 30 days supply | Qty: 30 | Fill #0

## 2020-12-04 MED FILL — TRAZODONE HCL 100 MG TABS: 100 | 30 days supply | Qty: 30 | Fill #0

## 2020-12-04 MED FILL — SERTRALINE HCL 100 MG TAB: 100 | 30 days supply | Qty: 30 | Fill #0

## 2020-12-04 NOTE — Telephone Encounter (Signed)
Rx sent 

## 2020-12-04 NOTE — Telephone Encounter (Signed)
Call from patient requesting medications. She no showed in Dec and can in Nov her appts with provider. We called in a rx for her Klonopin on 1/7 but she should be out of her Trazodone on 12/1 and her Abilify on 11/1. She has a scheduled appt with Dr Evelene Croon on 12/25/20 and will have enough Klonopin till her appt but is in need of the other two meds. Will ask Dr Evelene Croon to call it in if she feels it is appropriate otherwise I will advise her to come in as a walk in.

## 2020-12-04 NOTE — Addendum Note (Signed)
Addended by: Zena Amos on: 12/04/2020 09:51 AM   Modules accepted: Orders

## 2020-12-10 ENCOUNTER — Telehealth (HOSPITAL_COMMUNITY): Payer: Self-pay | Admitting: *Deleted

## 2020-12-10 NOTE — Telephone Encounter (Signed)
Third day in a row she has called stating her Zoloft was not at the pharmacy though every day she has called I have confirmed with the chart and once with [facility] pharmacy it was there. Today when she called clarified what pharmacy she picked her meds up at yesterday, and she had picked up from [place] and would like to stay with [place] and we had called it into community because she is without insurance.

## 2020-12-25 ENCOUNTER — Other Ambulatory Visit: Payer: Self-pay

## 2020-12-25 ENCOUNTER — Encounter (HOSPITAL_COMMUNITY): Payer: Self-pay | Admitting: Psychiatry

## 2020-12-25 ENCOUNTER — Telehealth (INDEPENDENT_AMBULATORY_CARE_PROVIDER_SITE_OTHER): Payer: No Payment, Other | Admitting: Psychiatry

## 2020-12-25 DIAGNOSIS — F418 Other specified anxiety disorders: Secondary | ICD-10-CM | POA: Diagnosis not present

## 2020-12-25 MED ORDER — TRAZODONE HCL 100 MG PO TABS: 100.0000 mg | ORAL_TABLET | Freq: Every day | ORAL | 2 refills | Status: DC

## 2020-12-25 MED ORDER — CLONAZEPAM 0.5 MG PO TABS: 0.5000 mg | ORAL_TABLET | Freq: Two times a day (BID) | ORAL | 2 refills | Status: DC

## 2020-12-25 MED ORDER — ARIPIPRAZOLE 5 MG PO TABS: 5.0000 mg | ORAL_TABLET | Freq: Every day | ORAL | 2 refills | Status: DC

## 2020-12-25 MED ORDER — SERTRALINE HCL 100 MG PO TABS: 100.0000 mg | ORAL_TABLET | Freq: Every day | ORAL | 2 refills | Status: DC

## 2020-12-25 NOTE — Progress Notes (Signed)
BH OP Progress Note  Virtual Visit via Telephone Note  I connected with Michele Black on 12/25/20 at 10:40 AM EST by telephone and verified that I am speaking with the correct person using two identifiers.  Location: Patient: home Provider: Clinic   I discussed the limitations, risks, security and privacy concerns of performing an evaluation and management service by telephone and the availability of in person appointments. I also discussed with the patient that there may be a patient responsible charge related to this service. The patient expressed understanding and agreed to proceed.   I provided 15 minutes of non-face-to-face time during this encounter.     Patient Identification: Michele Black MRN:  [id number] Date of Evaluation:  12/25/2020    Chief Complaint:  " I am doing better."    Visit Diagnosis:    ICD-10-CM   1. Depression with anxiety  F41.8     History of Present Illness: Patient reported that she is doing fine for the most part.  Patient was prescribed clonazepam to help with anxiety at the time of her last visit.  She informed that she has found clonazepam to be very helpful for anxiety.  She stated that her anxiety has improved. She stated that she is also sleeping better now.  She still working at the [employer]. She informed that she had a good Christmas holiday with her family. Regarding her anxiety related to getting into vehicles with others, patient stated that she still has anxiety but is slightly better. She requested for refills for all her medications and would like to continue the same regimen for now.  Past Psychiatric History: MDD, anxiety  Previous Psychotropic Medications: Yes   Substance Abuse History in the last 12 months:  No.  Consequences of Substance Abuse: NA  Past Medical History:  Past Medical History:  Diagnosis Date  . Acute myocardial infarction of other anterior wall, initial episode of care   . Anxiety   . Arthritis   .  Ischemic cardiomyopathy   . Mixed hyperlipidemia   . Tobacco use disorder     Past Surgical History:  Procedure Laterality Date  . LEFT HEART CATHETERIZATION WITH CORONARY ANGIOGRAM Bilateral 09/05/2013   Procedure: LEFT HEART CATHETERIZATION WITH CORONARY ANGIOGRAM;  Surgeon: Corky Crafts, MD;  Location: [facility] CATH LAB;  Service: Cardiovascular;  Laterality: Bilateral;  . PERCUTANEOUS CORONARY STENT INTERVENTION (PCI-S)  09/05/2013   Procedure: PERCUTANEOUS CORONARY STENT INTERVENTION (PCI-S);  Surgeon: Corky Crafts, MD;  Location: [facility] CATH LAB;  Service: Cardiovascular;;  DES to mid LAD    Family Psychiatric History: anxiety- mom  Family History:  Family History  Problem Relation Age of Onset  . Throat cancer Mother   . Heart attack Neg Hx     Social History:   Social History   Socioeconomic History  . Marital status: Divorced    Spouse name: Not on file  . Number of children: Not on file  . Years of education: Not on file  . Highest education level: Not on file  Occupational History  . Not on file  Tobacco Use  . Smoking status: Current Every Day Smoker    Packs/day: 1.00    Types: Cigarettes    Last attempt to quit: 09/05/2013    Years since quitting: 7.3  . Smokeless tobacco: Never Used  Substance and Sexual Activity  . Alcohol use: Yes    Alcohol/week: 1.0 standard drink    Types: 1 Shots of liquor per week  .  Drug use: No  . Sexual activity: Not on file  Other Topics Concern  . Not on file  Social History Narrative  . Not on file   Social Determinants of Health   Financial Resource Strain: Not on file  Food Insecurity: Not on file  Transportation Needs: Not on file  Physical Activity: Not on file  Stress: Not on file  Social Connections: Not on file    Additional Social History: Lives with her son his fiance and grandson.  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   She informed that she has total of 11 grandchildren.  Allergies:  No Known Allergies  Metabolic Disorder Labs: No results found for: HGBA1C, MPG No results found for: PROLACTIN Lab Results  Component Value Date   CHOL 131 08/08/2016   TRIG 68 08/08/2016   HDL 38 (L) 08/08/2016   CHOLHDL 3.4 08/08/2016   VLDL 14 08/08/2016   LDLCALC 79 08/08/2016   LDLCALC 83 02/19/2014   No results found for: TSH  Therapeutic Level Labs: No results found for: LITHIUM No results found for: CBMZ No results found for: VALPROATE  Current Medications: Current Outpatient Medications  Medication Sig Dispense Refill  . ARIPiprazole (ABILIFY) 5 MG tablet Take 1 tablet (5 mg total) by mouth daily. 30 tablet 0  . aspirin 81 MG chewable tablet Chew 1 tablet (81 mg total) by mouth daily.    Marland Kitchen atorvastatin (LIPITOR) 40 MG tablet TAKE 1 TABLET BY MOUTH DAILY AT 6 PM. 30 tablet 2  . clonazePAM (KLONOPIN) 0.5 MG tablet Take 1 tablet (0.5 mg total) by mouth 2 (two) times daily. 60 tablet 0  . clopidogrel (PLAVIX) 75 MG tablet Take 75 mg by mouth daily.    . clopidogrel (PLAVIX) 75 MG tablet TAKE 1 TABLET BY MOUTH DAILY. 90 tablet 1  . furosemide (LASIX) 20 MG tablet TAKE 1 TABLET BY MOUTH 2 TIMES DAILY AS NEEDED FOR FLUID OR EDEMA (SHORTNESS OF BREATH). 60 tablet 2  . lisinopril (PRINIVIL,ZESTRIL) 5 MG tablet TAKE 1 TABLET BY MOUTH DAILY. 30 tablet 2  . metoprolol tartrate (LOPRESSOR) 25 MG tablet TAKE 1/2 TABLET BY MOUTH 2 TIMES DAILY. 30 tablet 2  . nicotine (NICODERM CQ) 21 mg/24hr patch Place 1 patch (21 mg total) onto the skin daily. 28 patch 0  . nitroGLYCERIN (NITROSTAT) 0.4 MG SL tablet Place 1 tablet (0.4 mg total) under the tongue every 5 (five) minutes as needed for chest pain. 3 DOSES MAX 25 tablet 3  . ondansetron (ZOFRAN ODT) 4 MG disintegrating tablet Take 1 tablet (4 mg total) by mouth every 8 (eight) hours as needed for nausea or vomiting. 20 tablet 0  . potassium chloride SA (KLOR-CON M20) 20  MEQ tablet Take 2 tablets (40 mEq total) by mouth daily. (Patient taking differently: Take 40 mEq by mouth daily as needed (when taking Lasix). ) 90 tablet 1  . sertraline (ZOLOFT) 100 MG tablet Take 100 mg by mouth daily.    . sertraline (ZOLOFT) 100 MG tablet Take 1 tablet (100 mg total) by mouth daily. 30 tablet 0  . traZODone (DESYREL) 100 MG tablet Take 1 tablet (100 mg total) by mouth at bedtime. 30 tablet 0   No current facility-administered medications for this visit.    Musculoskeletal: Strength & Muscle Tone: within normal limits Gait & Station: normal Patient leans: N/A  Psychiatric Specialty Exam: Review  of Systems  There were no vitals taken for this visit.There is no height or weight on file to calculate BMI.  General Appearance: Unable to assess due to phone  Eye Contact:  Unable to assess due to phone  Speech:  Clear and Coherent and Normal Rate  Volume:  Normal  Mood:  Euthymic  Affect:  Congruent  Thought Process:  Goal Directed and Descriptions of Associations: Intact  Orientation:  Full (Time, Place, and Person)  Thought Content:  Logical  Suicidal Thoughts:  No  Homicidal Thoughts:  No  Memory:  Immediate;   Good Recent;   Good  Judgement:  Fair  Insight:  Fair  Psychomotor Activity:  Normal  Concentration:  Concentration: Good and Attention Span: Good  Recall:  Good  Fund of Knowledge:Good  Language: Good  Akathisia:  Negative  Handed:  Right  AIMS (if indicated):  0  Assets:  Communication Skills Desire for Improvement Financial Resources/Insurance Housing Physical Health Social Support  ADL's:  Intact  Cognition: WNL  Sleep:  Good      Assessment and Plan: Patient seems to be stable on her current regimen.  We will continue the same medications for now.   1. Depression with anxiety  - ARIPiprazole (ABILIFY) 5 MG tablet; Take 1 tablet (5 mg total) by mouth daily.  Dispense: 30 tablet; Refill: 2 - clonazePAM (KLONOPIN) 0.5 MG tablet;  Take 1 tablet (0.5 mg total) by mouth 2 (two) times daily.  Dispense: 60 tablet; Refill: 2 - sertraline (ZOLOFT) 100 MG tablet; Take 1 tablet (100 mg total) by mouth daily.  Dispense: 30 tablet; Refill: 2 - traZODone (DESYREL) 100 MG tablet; Take 1 tablet (100 mg total) by mouth at bedtime.  Dispense: 30 tablet; Refill: 2   Continue same medication regimen. Follow up in 3 months.    Zena Amos, MD 2/4/202210:32 AM

## 2021-03-22 ENCOUNTER — Telehealth (INDEPENDENT_AMBULATORY_CARE_PROVIDER_SITE_OTHER): Payer: No Payment, Other | Admitting: Psychiatry

## 2021-03-22 ENCOUNTER — Encounter (HOSPITAL_COMMUNITY): Payer: Self-pay | Admitting: Psychiatry

## 2021-03-22 ENCOUNTER — Other Ambulatory Visit: Payer: Self-pay

## 2021-03-22 DIAGNOSIS — F418 Other specified anxiety disorders: Secondary | ICD-10-CM | POA: Diagnosis not present

## 2021-03-22 MED ORDER — SERTRALINE HCL 100 MG PO TABS: 100.0000 mg | ORAL_TABLET | Freq: Every day | ORAL | 2 refills | Status: DC

## 2021-03-22 MED ORDER — ARIPIPRAZOLE 5 MG PO TABS: 5.0000 mg | ORAL_TABLET | Freq: Every day | ORAL | 2 refills | Status: DC

## 2021-03-22 MED ORDER — CLONAZEPAM 0.5 MG PO TABS: 0.5000 mg | ORAL_TABLET | Freq: Two times a day (BID) | ORAL | 2 refills | Status: DC

## 2021-03-22 MED ORDER — TRAZODONE HCL 100 MG PO TABS: 100.0000 mg | ORAL_TABLET | Freq: Every day | ORAL | 2 refills | Status: DC

## 2021-03-22 NOTE — Progress Notes (Signed)
BH OP Progress Note  Virtual Visit via Telephone Note  I connected with Michele Black on 03/22/21 at  2:00 PM EDT by telephone and verified that I am speaking with the correct person using two identifiers.  Location: Patient: home Provider: Clinic   I discussed the limitations, risks, security and privacy concerns of performing an evaluation and management service by telephone and the availability of in person appointments. I also discussed with the patient that there may be a patient responsible charge related to this service. The patient expressed understanding and agreed to proceed.   I provided 14 minutes of non-face-to-face time during this encounter.      Patient Identification: Michele Black MRN:  [id number] Date of Evaluation:  03/22/2021    Chief Complaint:  " I am doing fine."    Visit Diagnosis:    ICD-10-CM   1. Depression with anxiety  F41.8 ARIPiprazole (ABILIFY) 5 MG tablet    clonazePAM (KLONOPIN) 0.5 MG tablet    sertraline (ZOLOFT) 100 MG tablet    traZODone (DESYREL) 100 MG tablet    History of Present Illness: Patient reported that she is doing well.  She informed that her mood has been stable.  She is sleeping well with the help of trazodone. She still works at the [employer]. She informed that she recently moved and she is now living with her son.  She stated that she likes her new house. She stated that her anxiety about getting intubated because has reduced and she is managing better now. She finds clonazepam to be very helpful. She denied any other issues or concerns today and requested refills.   Past Psychiatric History: MDD, anxiety  Previous Psychotropic Medications: Yes   Substance Abuse History in the last 12 months:  No.  Consequences of Substance Abuse: NA  Past Medical History:  Past Medical History:  Diagnosis Date  . Acute myocardial infarction of other anterior wall, initial episode of care   . Anxiety   . Arthritis   .  Ischemic cardiomyopathy   . Mixed hyperlipidemia   . Tobacco use disorder     Past Surgical History:  Procedure Laterality Date  . LEFT HEART CATHETERIZATION WITH CORONARY ANGIOGRAM Bilateral 09/05/2013   Procedure: LEFT HEART CATHETERIZATION WITH CORONARY ANGIOGRAM;  Surgeon: Corky Crafts, MD;  Location: [facility] CATH LAB;  Service: Cardiovascular;  Laterality: Bilateral;  . PERCUTANEOUS CORONARY STENT INTERVENTION (PCI-S)  09/05/2013   Procedure: PERCUTANEOUS CORONARY STENT INTERVENTION (PCI-S);  Surgeon: Corky Crafts, MD;  Location: [facility] CATH LAB;  Service: Cardiovascular;;  DES to mid LAD    Family Psychiatric History: anxiety- mom  Family History:  Family History  Problem Relation Age of Onset  . Throat cancer Mother   . Heart attack Neg Hx     Social History:   Social History   Socioeconomic History  . Marital status: Divorced    Spouse name: Not on file  . Number of children: Not on file  . Years of education: Not on file  . Highest education level: Not on file  Occupational History  . Not on file  Tobacco Use  . Smoking status: Current Every Day Smoker    Packs/day: 1.00    Types: Cigarettes    Last attempt to quit: 09/05/2013    Years since quitting: 7.5  . Smokeless tobacco: Never Used  Substance and Sexual Activity  . Alcohol use: Yes    Alcohol/week: 1.0 standard drink    Types: 1 Shots of  liquor per week  . Drug use: No  . Sexual activity: Not on file  Other Topics Concern  . Not on file  Social History Narrative  . Not on file   Social Determinants of Health   Financial Resource Strain: Not on file  Food Insecurity: Not on file  Transportation Needs: Not on file  Physical Activity: Not on file  Stress: Not on file  Social Connections: Not on file    Additional Social History: Lives with her son his fiance and grandson.  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   She informed that she has total of 11 grandchildren.  Allergies:  Not on File  Metabolic Disorder Labs: No results found for: HGBA1C, MPG No results found for: PROLACTIN Lab Results  Component Value Date   CHOL 131 08/08/2016   TRIG 68 08/08/2016   HDL 38 (L) 08/08/2016   CHOLHDL 3.4 08/08/2016   VLDL 14 08/08/2016   LDLCALC 79 08/08/2016   LDLCALC 83 02/19/2014   No results found for: TSH  Therapeutic Level Labs: No results found for: LITHIUM No results found for: CBMZ No results found for: VALPROATE  Current Medications: Current Outpatient Medications  Medication Sig Dispense Refill  . ARIPiprazole (ABILIFY) 5 MG tablet Take 1 tablet (5 mg total) by mouth daily. 30 tablet 2  . aspirin 81 MG chewable tablet Chew 1 tablet (81 mg total) by mouth daily.    Marland Kitchen atorvastatin (LIPITOR) 40 MG tablet TAKE 1 TABLET BY MOUTH DAILY AT 6 PM. 30 tablet 2  . clonazePAM (KLONOPIN) 0.5 MG tablet Take 1 tablet (0.5 mg total) by mouth 2 (two) times daily. 60 tablet 2  . clopidogrel (PLAVIX) 75 MG tablet TAKE 1 TABLET BY MOUTH DAILY. 90 tablet 1  . furosemide (LASIX) 20 MG tablet TAKE 1 TABLET BY MOUTH 2 TIMES DAILY AS NEEDED FOR FLUID OR EDEMA (SHORTNESS OF BREATH). 60 tablet 2  . lisinopril (PRINIVIL,ZESTRIL) 5 MG tablet TAKE 1 TABLET BY MOUTH DAILY. 30 tablet 2  . metoprolol tartrate (LOPRESSOR) 25 MG tablet TAKE 1/2 TABLET BY MOUTH 2 TIMES DAILY. 30 tablet 2  . nicotine (NICODERM CQ) 21 mg/24hr patch Place 1 patch (21 mg total) onto the skin daily. 28 patch 0  . nitroGLYCERIN (NITROSTAT) 0.4 MG SL tablet Place 1 tablet (0.4 mg total) under the tongue every 5 (five) minutes as needed for chest pain. 3 DOSES MAX 25 tablet 3  . ondansetron (ZOFRAN ODT) 4 MG disintegrating tablet Take 1 tablet (4 mg total) by mouth every 8 (eight) hours as needed for nausea or vomiting. 20 tablet 0  . potassium chloride SA (KLOR-CON M20) 20 MEQ tablet Take 2 tablets (40 mEq total) by mouth daily. (Patient taking  differently: Take 40 mEq by mouth daily as needed (when taking Lasix). ) 90 tablet 1  . sertraline (ZOLOFT) 100 MG tablet Take 1 tablet (100 mg total) by mouth daily. 30 tablet 2  . traZODone (DESYREL) 100 MG tablet Take 1 tablet (100 mg total) by mouth at bedtime. 30 tablet 2   No current facility-administered medications for this visit.    Musculoskeletal: Strength & Muscle Tone: within normal limits Gait & Station: normal Patient leans: N/A  Psychiatric Specialty Exam: Review of Systems  There were no vitals taken for this visit.There is no height or weight on file to calculate BMI.  General Appearance: Unable  to assess due to phone  Eye Contact:  Unable to assess due to phone  Speech:  Clear and Coherent and Normal Rate  Volume:  Normal  Mood:  Euthymic  Affect:  Congruent  Thought Process:  Goal Directed and Descriptions of Associations: Intact  Orientation:  Full (Time, Place, and Person)  Thought Content:  Logical  Suicidal Thoughts:  No  Homicidal Thoughts:  No  Memory:  Immediate;   Good Recent;   Good  Judgement:  Fair  Insight:  Fair  Psychomotor Activity:  Normal  Concentration:  Concentration: Good and Attention Span: Good  Recall:  Good  Fund of Knowledge:Good  Language: Good  Akathisia:  Negative  Handed:  Right  AIMS (if indicated):  0  Assets:  Communication Skills Desire for Improvement Financial Resources/Insurance Housing Physical Health Social Support  ADL's:  Intact  Cognition: WNL  Sleep:  Good      Assessment and Plan: Pt is stable on her current regimen.  1. Depression with anxiety  - ARIPiprazole (ABILIFY) 5 MG tablet; Take 1 tablet (5 mg total) by mouth daily.  Dispense: 30 tablet; Refill: 2 - clonazePAM (KLONOPIN) 0.5 MG tablet; Take 1 tablet (0.5 mg total) by mouth 2 (two) times daily.  Dispense: 60 tablet; Refill: 2 - sertraline (ZOLOFT) 100 MG tablet; Take 1 tablet (100 mg total) by mouth daily.  Dispense: 30 tablet; Refill: 2 -  traZODone (DESYREL) 100 MG tablet; Take 1 tablet (100 mg total) by mouth at bedtime.  Dispense: 30 tablet; Refill: 2   Continue same medication regimen. Follow up in 3 months.    Zena Amos, MD 5/2/20221:35 PM

## 2021-06-04 ENCOUNTER — Telehealth (HOSPITAL_COMMUNITY): Payer: Self-pay | Admitting: Psychiatry

## 2021-06-04 ENCOUNTER — Other Ambulatory Visit (HOSPITAL_COMMUNITY): Payer: Self-pay | Admitting: Psychiatry

## 2021-06-04 DIAGNOSIS — F418 Other specified anxiety disorders: Secondary | ICD-10-CM

## 2021-06-04 MED ORDER — SERTRALINE HCL 100 MG PO TABS: 100.0000 mg | ORAL_TABLET | Freq: Every day | ORAL | 2 refills | Status: DC

## 2021-06-04 MED ORDER — TRAZODONE HCL 100 MG PO TABS
100.0000 mg | ORAL_TABLET | Freq: Every day | ORAL | 2 refills | Status: DC
Start: 2021-06-04 — End: 2021-07-23

## 2021-06-04 MED ORDER — CLONAZEPAM 0.5 MG PO TABS: 0.5000 mg | ORAL_TABLET | Freq: Two times a day (BID) | ORAL | 2 refills | Status: DC

## 2021-06-04 MED ORDER — ARIPIPRAZOLE 5 MG PO TABS: 5.0000 mg | ORAL_TABLET | Freq: Every day | ORAL | 2 refills | Status: DC

## 2021-06-04 NOTE — Telephone Encounter (Signed)
Medications refilled and sent to preferred pharmacy.

## 2021-06-04 NOTE — Telephone Encounter (Signed)
Patient contacted per provider to reschedule. Patient was agreeable to next available 9/2 appt. Please bridge medications.

## 2021-06-16 ENCOUNTER — Telehealth (HOSPITAL_COMMUNITY): Payer: No Payment, Other | Admitting: Psychiatry

## 2021-07-23 ENCOUNTER — Telehealth (INDEPENDENT_AMBULATORY_CARE_PROVIDER_SITE_OTHER): Payer: No Payment, Other | Admitting: Psychiatry

## 2021-07-23 ENCOUNTER — Encounter (HOSPITAL_COMMUNITY): Payer: Self-pay | Admitting: Psychiatry

## 2021-07-23 DIAGNOSIS — F418 Other specified anxiety disorders: Secondary | ICD-10-CM | POA: Diagnosis not present

## 2021-07-23 MED ORDER — SERTRALINE HCL 100 MG PO TABS: 100.0000 mg | ORAL_TABLET | Freq: Every day | ORAL | 3 refills | Status: DC

## 2021-07-23 MED ORDER — CLONAZEPAM 0.5 MG PO TABS: 0.5000 mg | ORAL_TABLET | Freq: Two times a day (BID) | ORAL | 2 refills | Status: DC

## 2021-07-23 MED ORDER — TRAZODONE HCL 100 MG PO TABS: 100.0000 mg | ORAL_TABLET | Freq: Every day | ORAL | 3 refills | Status: DC

## 2021-07-23 MED ORDER — ARIPIPRAZOLE 5 MG PO TABS: 5.0000 mg | ORAL_TABLET | Freq: Every day | ORAL | 3 refills | Status: DC
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
## 2021-10-21 ENCOUNTER — Encounter (HOSPITAL_COMMUNITY): Payer: Self-pay | Admitting: Psychiatry

## 2021-10-21 ENCOUNTER — Telehealth (INDEPENDENT_AMBULATORY_CARE_PROVIDER_SITE_OTHER): Payer: No Payment, Other | Admitting: Psychiatry

## 2021-10-21 DIAGNOSIS — F418 Other specified anxiety disorders: Secondary | ICD-10-CM

## 2021-10-21 MED ORDER — CLONAZEPAM 0.5 MG PO TABS: 0.5000 mg | ORAL_TABLET | Freq: Two times a day (BID) | ORAL | 3 refills | Status: DC

## 2021-10-21 MED ORDER — SERTRALINE HCL 100 MG PO TABS: 100.0000 mg | ORAL_TABLET | Freq: Every day | ORAL | 3 refills | Status: DC

## 2021-10-21 MED ORDER — TRAZODONE HCL 100 MG PO TABS: 100.0000 mg | ORAL_TABLET | Freq: Every day | ORAL | 3 refills | Status: DC

## 2021-10-21 MED ORDER — ARIPIPRAZOLE 5 MG PO TABS: 5.0000 mg | ORAL_TABLET | Freq: Every day | ORAL | 3 refills | Status: DC

## 2021-10-21 NOTE — Progress Notes (Signed)
BH MD/PA/NP OP Progress Note Virtual Visit via Telephone Note  I connected with Michele Black on 10/21/21 at  1:30 PM EST by telephone and verified that I am speaking with the correct person using two identifiers.  Location: Patient: home Provider: Clinic   I discussed the limitations, risks, security and privacy concerns of performing an evaluation and management service by telephone and the availability of in person appointments. I also discussed with the patient that there may be a patient responsible charge related to this service. The patient expressed understanding and agreed to proceed.   I provided 30 minutes of non-face-to-face time during this encounter.  10/21/2021 9:49 AM Michele Black  MRN:  [id number]  Chief Complaint: "I feel fine"  HPI: 60 year old female seen today for follow up psychiatric evaluation. She has a psychiatric history of anxiety, depression, and tobacco dependence. She is currently managed on Abilify 5 mg daily, Klonopin 0.5 mg twice daily as needed, Trazodone 100 mg nightly as needed, and Zoloft 100 mg daily. She notes her medications are effective in managing her psychiatric conditions.  Today she was unable to logon virtually so assessment was done over the phone.  During exam she was pleasant, cooperative, engaged in conversation.  She informed Clinical research associate that overall she feels fine.  Patient notes at times she feels tired and restless however reports that she is able to cope with it.  She does note that she has mild irritability however notes that she can cope with this as well.  Today provider conducted a GAD-7 and patient scored a 6, at her last visit she scored a 15.  Provider also conducted PHQ-9 and patient scored a 7, at her last visit she scored a 5.  She notes that her appetite continues to be poor however notes that she is maintaining her weight.  She endorses adequate sleep. Today she denies SI/HI/VAH, mania, or paranoia.   No medication changes made today.  Patient agreeable to continue medications as prescribed. No other concerns noted at this time.  Visit Diagnosis:  No diagnosis found.   Past Psychiatric History: anxiety, depression, and tobacco dependence.  Past Medical History:  Past Medical History:  Diagnosis Date   Acute myocardial infarction of other anterior wall, initial episode of care    Anxiety    Arthritis    Ischemic cardiomyopathy    Mixed hyperlipidemia    Tobacco use disorder     Past Surgical History:  Procedure Laterality Date   LEFT HEART CATHETERIZATION WITH CORONARY ANGIOGRAM Bilateral 09/05/2013   Procedure: LEFT HEART CATHETERIZATION WITH CORONARY ANGIOGRAM;  Surgeon: Corky Crafts, MD;  Location: [facility] CATH LAB;  Service: Cardiovascular;  Laterality: Bilateral;   PERCUTANEOUS CORONARY STENT INTERVENTION (PCI-S)  09/05/2013   Procedure: PERCUTANEOUS CORONARY STENT INTERVENTION (PCI-S);  Surgeon: Corky Crafts, MD;  Location: [facility] CATH LAB;  Service: Cardiovascular;;  DES to mid LAD    Family Psychiatric History: anxiety- mom  Family History:  Family History  Problem Relation Age of Onset   Throat cancer Mother    Heart attack Neg Hx     Social History:  Social History   Socioeconomic History   Marital status: Divorced    Spouse name: Not on file   Number of children: Not on file   Years of education: Not on file   Highest education level: Not on file  Occupational History   Not on file  Tobacco Use   Smoking status: Every Day    Packs/day:  1.00    Types: Cigarettes    Last attempt to quit: 09/05/2013    Years since quitting: 8.1   Smokeless tobacco: Never  Substance and Sexual Activity   Alcohol use: Yes    Alcohol/week: 1.0 standard drink    Types: 1 Shots of liquor per week   Drug use: No   Sexual activity: Not on file  Other Topics Concern   Not on file  Social History Narrative   Not on file   Social Determinants of Health   Financial Resource Strain: Not on file   Food Insecurity: Not on file  Transportation Needs: Not on file  Physical Activity: Not on file  Stress: Not on file  Social Connections: Not on file    Allergies: Not on File  Metabolic Disorder Labs: No results found for: HGBA1C, MPG No results found for: PROLACTIN Lab Results  Component Value Date   CHOL 131 08/08/2016   TRIG 68 08/08/2016   HDL 38 (L) 08/08/2016   CHOLHDL 3.4 08/08/2016   VLDL 14 08/08/2016   LDLCALC 79 08/08/2016   LDLCALC 83 02/19/2014   No results found for: TSH  Therapeutic Level Labs: No results found for: LITHIUM No results found for: VALPROATE No components found for:  CBMZ  Current Medications: Current Outpatient Medications  Medication Sig Dispense Refill   ARIPiprazole (ABILIFY) 5 MG tablet Take 1 tablet (5 mg total) by mouth daily. 30 tablet 3   aspirin 81 MG chewable tablet Chew 1 tablet (81 mg total) by mouth daily.     atorvastatin (LIPITOR) 40 MG tablet TAKE 1 TABLET BY MOUTH DAILY AT 6 PM. 30 tablet 2   clonazePAM (KLONOPIN) 0.5 MG tablet Take 1 tablet (0.5 mg total) by mouth 2 (two) times daily. 60 tablet 2   clopidogrel (PLAVIX) 75 MG tablet TAKE 1 TABLET BY MOUTH DAILY. 90 tablet 1   furosemide (LASIX) 20 MG tablet TAKE 1 TABLET BY MOUTH 2 TIMES DAILY AS NEEDED FOR FLUID OR EDEMA (SHORTNESS OF BREATH). 60 tablet 2   lisinopril (PRINIVIL,ZESTRIL) 5 MG tablet TAKE 1 TABLET BY MOUTH DAILY. 30 tablet 2   metoprolol tartrate (LOPRESSOR) 25 MG tablet TAKE 1/2 TABLET BY MOUTH 2 TIMES DAILY. 30 tablet 2   nitroGLYCERIN (NITROSTAT) 0.4 MG SL tablet Place 1 tablet (0.4 mg total) under the tongue every 5 (five) minutes as needed for chest pain. 3 DOSES MAX 25 tablet 3   ondansetron (ZOFRAN ODT) 4 MG disintegrating tablet Take 1 tablet (4 mg total) by mouth every 8 (eight) hours as needed for nausea or vomiting. 20 tablet 0   potassium chloride SA (KLOR-CON M20) 20 MEQ tablet Take 2 tablets (40 mEq total) by mouth daily. (Patient taking  differently: Take 40 mEq by mouth daily as needed (when taking Lasix). ) 90 tablet 1   sertraline (ZOLOFT) 100 MG tablet Take 1 tablet (100 mg total) by mouth daily. 30 tablet 3   traZODone (DESYREL) 100 MG tablet Take 1 tablet (100 mg total) by mouth at bedtime. 30 tablet 3   No current facility-administered medications for this visit.     Musculoskeletal: Strength & Muscle Tone:  Unable to assess due to telephone visit Gait & Station:  Unable to assess due to telephone visit Patient leans: N/A  Psychiatric Specialty Exam: Review of Systems  There were no vitals taken for this visit.There is no height or weight on file to calculate BMI.  General Appearance:  Unable to assess due to telephone  visit  Eye Contact:   Unable to assess due to telephone visit  Speech:  Clear and coherant  Volume:  Normal  Mood:  Anxious  Affect:  Appropriate and Congruent  Thought Process:  Coherent, Goal Directed, and Linear  Orientation:  Full (Time, Place, and Person)  Thought Content: WDL and Logical   Suicidal Thoughts:  No  Homicidal Thoughts:  No  Memory:  Immediate;   Good Recent;   Good Remote;   Good  Judgement:  Good  Insight:  Good  Psychomotor Activity:  Normal  Concentration:  Concentration: Good and Attention Span: Good  Recall:  Good  Fund of Knowledge: Good  Language: Good  Akathisia:  Unable to assess due to telephone visit  Handed:  Right  AIMS (if indicated): not done  Assets:  Communication Skills Desire for Improvement Financial Resources/Insurance Housing Physical Health Social Support  ADL's:  Intact  Cognition: WNL  Sleep:  Good   Screenings: GAD-7    Flowsheet Row Video Visit from 07/23/2021 in [facility]  Total GAD-7 Score 16      PHQ2-9    Flowsheet Row Video Visit from 07/23/2021 in [facility]  PHQ-2 Total Score 1  PHQ-9 Total Score 5        Assessment and Plan: Patient notes that she is  doing well on her current medication regimen.  No medication changes made today.  Patient agreeable to continue medication as prescribed.  1. Depression with anxiety  Continue- ARIPiprazole (ABILIFY) 5 MG tablet; Take 1 tablet (5 mg total) by mouth daily.  Dispense: 30 tablet; Refill: 3 Continue- clonazePAM (KLONOPIN) 0.5 MG tablet; Take 1 tablet (0.5 mg total) by mouth 2 (two) times daily.  Dispense: 60 tablet; Refill: 3 Continue- sertraline (ZOLOFT) 100 MG tablet; Take 1 tablet (100 mg total) by mouth daily.  Dispense: 30 tablet; Refill: 3 Continue- traZODone (DESYREL) 100 MG tablet; Take 1 tablet (100 mg total) by mouth at bedtime.  Dispense: 30 tablet; Refill: 3  Follow up in 3 months   Shanna Cisco, NP 10/21/2021, 9:49 AM

## 2021-10-22 ENCOUNTER — Telehealth (HOSPITAL_COMMUNITY): Payer: No Payment, Other | Admitting: Psychiatry

## 2021-11-08 ENCOUNTER — Emergency Department (HOSPITAL_COMMUNITY): Payer: No Typology Code available for payment source

## 2021-11-08 ENCOUNTER — Emergency Department (HOSPITAL_COMMUNITY)
Admission: EM | Admit: 2021-11-08 | Discharge: 2021-11-09 | Disposition: A | Discharge status: Home / Self Care | Payer: No Typology Code available for payment source | Attending: Emergency Medicine | Admitting: Emergency Medicine

## 2021-11-08 ENCOUNTER — Other Ambulatory Visit: Payer: Self-pay

## 2021-11-08 ENCOUNTER — Encounter (HOSPITAL_COMMUNITY): Payer: Self-pay

## 2021-11-08 DIAGNOSIS — Z79899 Other long term (current) drug therapy: Secondary | ICD-10-CM | POA: Insufficient documentation

## 2021-11-08 DIAGNOSIS — Z955 Presence of coronary angioplasty implant and graft: Secondary | ICD-10-CM | POA: Insufficient documentation

## 2021-11-08 DIAGNOSIS — Z20822 Contact with and (suspected) exposure to covid-19: Secondary | ICD-10-CM | POA: Insufficient documentation

## 2021-11-08 DIAGNOSIS — R2689 Other abnormalities of gait and mobility: Secondary | ICD-10-CM | POA: Insufficient documentation

## 2021-11-08 DIAGNOSIS — F17210 Nicotine dependence, cigarettes, uncomplicated: Secondary | ICD-10-CM | POA: Insufficient documentation

## 2021-11-08 DIAGNOSIS — Z7982 Long term (current) use of aspirin: Secondary | ICD-10-CM | POA: Insufficient documentation

## 2021-11-08 DIAGNOSIS — I2510 Atherosclerotic heart disease of native coronary artery without angina pectoris: Secondary | ICD-10-CM | POA: Insufficient documentation

## 2021-11-08 DIAGNOSIS — R791 Abnormal coagulation profile: Secondary | ICD-10-CM | POA: Insufficient documentation

## 2021-11-08 DIAGNOSIS — R2990 Unspecified symptoms and signs involving the nervous system: Secondary | ICD-10-CM

## 2021-11-08 LAB — CBC
HCT: 32.7 % — ABNORMAL LOW (ref 36.0–46.0)
Hemoglobin: 11.1 g/dL — ABNORMAL LOW (ref 12.0–15.0)
MCH: 32.7 pg (ref 26.0–34.0)
MCHC: 33.9 g/dL (ref 30.0–36.0)
MCV: 96.5 fL (ref 80.0–100.0)
Platelets: 124 10*3/uL — ABNORMAL LOW (ref 150–400)
RBC: 3.39 MIL/uL — ABNORMAL LOW (ref 3.87–5.11)
RDW: 12.7 % (ref 11.5–15.5)
WBC: 3 10*3/uL — ABNORMAL LOW (ref 4.0–10.5)
nRBC: 0 % (ref 0.0–0.2)

## 2021-11-08 LAB — COMPREHENSIVE METABOLIC PANEL
ALT: 12 U/L (ref 0–44)
AST: 16 U/L (ref 15–41)
Albumin: 3.9 g/dL (ref 3.5–5.0)
Alkaline Phosphatase: 40 U/L (ref 38–126)
Anion gap: 4 — ABNORMAL LOW (ref 5–15)
BUN: 8 mg/dL (ref 8–23)
CO2: 27 mmol/L (ref 22–32)
Calcium: 8.7 mg/dL — ABNORMAL LOW (ref 8.9–10.3)
Chloride: 105 mmol/L (ref 98–111)
Creatinine, Ser: 0.86 mg/dL (ref 0.44–1.00)
GFR, Estimated: >60 mL/min (ref >60)
Glucose, Bld: 124 mg/dL — ABNORMAL HIGH (ref 70–99)
Potassium: 3.2 mmol/L — ABNORMAL LOW (ref 3.5–5.1)
Sodium: 136 mmol/L (ref 135–145)
Total Bilirubin: 0.6 mg/dL (ref 0.3–1.2)
Total Protein: 8 g/dL (ref 6.5–8.1)

## 2021-11-08 LAB — APTT: aPTT: 27 seconds (ref 24–36)

## 2021-11-08 LAB — DIFFERENTIAL
Abs Immature Granulocytes: 0 10*3/uL (ref 0.00–0.07)
Basophils Absolute: 0 10*3/uL (ref 0.0–0.1)
Basophils Relative: 0 %
Eosinophils Absolute: 0 10*3/uL (ref 0.0–0.5)
Eosinophils Relative: 1 %
Immature Granulocytes: 0 %
Lymphocytes Relative: 43 %
Lymphs Abs: 1.3 10*3/uL (ref 0.7–4.0)
Monocytes Absolute: 0.2 10*3/uL (ref 0.1–1.0)
Monocytes Relative: 6 %
Neutro Abs: 1.5 10*3/uL — ABNORMAL LOW (ref 1.7–7.7)
Neutrophils Relative %: 50 %

## 2021-11-08 LAB — RESP PANEL BY RT-PCR (FLU A&B, COVID) ARPGX2
Influenza A by PCR: NEGATIVE
Influenza B by PCR: NEGATIVE
SARS Coronavirus 2 by RT PCR: NEGATIVE

## 2021-11-08 LAB — PROTIME-INR
INR: 1.1 (ref 0.8–1.2)
Prothrombin Time: 14.4 seconds (ref 11.4–15.2)

## 2021-11-08 LAB — ETHANOL: Alcohol, Ethyl (B): <10 mg/dL (ref <10)

## 2021-11-08 NOTE — ED Triage Notes (Signed)
Pt reports feeling unsteady on her feet since around yesterday evening. Denies weakness, numbness, slurred speech, and confusion. Equal grips, strength, and sensation. Without facial droop.

## 2021-11-08 NOTE — ED Provider Notes (Signed)
Emergency Medicine Provider Triage Evaluation Note  Michele Black , a 60 y.o. female  was evaluated in triage.  Pt complains of leaning towards the right side starting last evening.  Patient states that her family noted that she started leaning to the side while she was sitting and walking.  They were concerned that she had a stroke.  Patient has never had a stroke before but has heart disease. Patient denies other signs of stroke including: facial droop, slurred speech, aphasia, weakness/numbness in extremities.   Review of Systems  Positive: Unsteadiness Negative: Facial droop, trouble with speech, weakness  Physical Exam  BP 135/77 (BP Location: Right Arm)    Pulse 89    Temp 97.8 F (36.6 C) (Oral)    Resp 18    SpO2 95%  Gen:   Awake, no distress   Resp:  Normal effort  MSK:   Moves extremities without difficulty  Other:  Gross neuro exam is normal, no obvious unilateral weakness.  Patient is able to get up and out of the chair without any difficulties, negative Romberg  Medical Decision Making  Medically screening exam initiated at 2:53 PM.  Appropriate orders placed.  Kelaiah Escalona was informed that the remainder of the evaluation will be completed by another provider, this initial triage assessment does not replace that evaluation, and the importance of remaining in the ED until their evaluation is complete.     Renne Crigler, PA-C 11/08/21 1454    Lorre Nick, MD 11/10/21 [phone]

## 2021-11-08 NOTE — ED Notes (Signed)
Pt currently in restroom given urine sample

## 2021-11-09 ENCOUNTER — Emergency Department (HOSPITAL_COMMUNITY): Payer: No Typology Code available for payment source

## 2021-11-09 LAB — RAPID URINE DRUG SCREEN, HOSP PERFORMED
Amphetamines: NOT DETECTED
Barbiturates: NOT DETECTED
Benzodiazepines: NOT DETECTED
Cocaine: NOT DETECTED
Opiates: NOT DETECTED
Tetrahydrocannabinol: POSITIVE — AB

## 2021-11-09 LAB — URINALYSIS, ROUTINE W REFLEX MICROSCOPIC
Bilirubin Urine: NEGATIVE
Glucose, UA: NEGATIVE mg/dL
Ketones, ur: NEGATIVE mg/dL
Nitrite: NEGATIVE
Protein, ur: NEGATIVE mg/dL
Specific Gravity, Urine: 1.014 (ref 1.005–1.030)
pH: 6 (ref 5.0–8.0)

## 2021-11-09 MED ORDER — NICOTINE 21 MG/24HR TD PT24
21.0000 mg | MEDICATED_PATCH | Freq: Once | TRANSDERMAL | Status: DC
  Filled 2021-11-09: qty 1

## 2021-11-09 MED ORDER — TRAZODONE HCL 100 MG PO TABS
100.0000 mg | ORAL_TABLET | Freq: Every day | ORAL | Status: DC
  Administered 2021-11-09: 03:00:00 100 mg via ORAL
  Filled 2021-11-09: qty 1

## 2021-11-09 MED ORDER — LORAZEPAM 2 MG/ML IJ SOLN
1.0000 mg | Freq: Once | INTRAMUSCULAR | Status: AC
  Administered 2021-11-09: 03:00:00 1 mg via INTRAVENOUS
  Filled 2021-11-09: qty 1

## 2021-11-09 NOTE — ED Provider Notes (Signed)
[facility]-EMERGENCY DEPT Provider Note   CSN: [id number] Arrival date & time: 11/08/21  1436     History Chief Complaint  Patient presents with   Gait Problem    Shaneisha Burkel is a 60 y.o. female.  Patient with history of anxiety, tobacco abuse presents for evaluation of the appearance of leaning to the right while both sitting and walking. The patient feels she is leaning while sitting but not while walking. She denies begin off balance, falls. No visual change, dizziness/lightheadedness, trouble speaking. No slurred speech or facial asymmetry noted by family. She denies weakness or dropping objects. No headache.   The history is provided by the patient. No language interpreter was used.      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   Mixed hyperlipidemia    Ischemic cardiomyopathy    Acute myocardial infarction of other anterior wall, initial episode of care    Tobacco use disorder     Past Surgical History:  Procedure Laterality Date   LEFT HEART CATHETERIZATION WITH CORONARY ANGIOGRAM Bilateral 09/05/2013   Procedure: LEFT HEART CATHETERIZATION WITH CORONARY ANGIOGRAM;  Surgeon: Corky Crafts, MD;  Location: [facility] CATH LAB;  Service: Cardiovascular;  Laterality: Bilateral;   PERCUTANEOUS CORONARY STENT INTERVENTION (PCI-S)  09/05/2013   Procedure: PERCUTANEOUS CORONARY STENT INTERVENTION (PCI-S);  Surgeon: Corky Crafts, MD;  Location: [facility] CATH LAB;  Service: Cardiovascular;;  DES to mid LAD     OB History   No obstetric history on file.     Family History  Problem Relation Age of Onset    Throat cancer Mother    Heart attack Neg Hx     Social History   Tobacco Use   Smoking status: Every Day    Packs/day: 1.00    Types: Cigarettes    Last attempt to quit: 09/05/2013    Years since quitting: 8.1   Smokeless tobacco: Never  Substance Use Topics   Alcohol use: Yes    Alcohol/week: 1.0 standard drink    Types: 1 Shots of liquor per week   Drug use: No    Home Medications Prior to Admission medications   Medication Sig Start Date End Date Taking? Authorizing Provider  ARIPiprazole (ABILIFY) 5 MG tablet Take 1 tablet (5 mg total) by mouth daily. 10/21/21   Shanna Cisco, NP  aspirin 81 MG chewable tablet Chew 1 tablet (81 mg total) by mouth daily. 10/09/13   Richarda Overlie, MD  atorvastatin (LIPITOR) 40 MG tablet TAKE 1 TABLET BY MOUTH DAILY AT 6 PM. 10/06/17   Corky Crafts, MD  clonazePAM (KLONOPIN) 0.5 MG tablet Take 1 tablet (0.5 mg total) by mouth 2 (two) times daily. 10/21/21   Shanna Cisco, NP  clopidogrel (PLAVIX) 75 MG tablet TAKE 1 TABLET BY MOUTH DAILY. 07/27/17   Corky Crafts, MD  furosemide (LASIX) 20 MG tablet TAKE 1 TABLET BY MOUTH 2 TIMES DAILY AS NEEDED FOR FLUID OR EDEMA (SHORTNESS OF BREATH). 10/06/17   Corky Crafts, MD  lisinopril (PRINIVIL,ZESTRIL) 5 MG tablet TAKE 1 TABLET BY MOUTH DAILY.  10/06/17   Corky Crafts, MD  metoprolol tartrate (LOPRESSOR) 25 MG tablet TAKE 1/2 TABLET BY MOUTH 2 TIMES DAILY. 10/06/17   Corky Crafts, MD  nitroGLYCERIN (NITROSTAT) 0.4 MG SL tablet Place 1 tablet (0.4 mg total) under the tongue every 5 (five) minutes as needed for chest pain. 3 DOSES MAX 08/08/16   Corky Crafts, MD  ondansetron (ZOFRAN ODT) 4 MG disintegrating tablet Take 1 tablet (4 mg total) by mouth every 8 (eight) hours as needed for nausea or vomiting. 01/26/17   Alvira Monday, MD  potassium chloride SA (KLOR-CON M20) 20 MEQ tablet Take 2 tablets (40 mEq total) by mouth daily. Patient taking  differently: Take 40 mEq by mouth daily as needed (when taking Lasix).  08/08/16   Corky Crafts, MD  sertraline (ZOLOFT) 100 MG tablet Take 1 tablet (100 mg total) by mouth daily. 10/21/21   Shanna Cisco, NP  traZODone (DESYREL) 100 MG tablet Take 1 tablet (100 mg total) by mouth at bedtime. 10/21/21   Shanna Cisco, NP    Allergies    Patient has no known allergies.  Review of Systems   Review of Systems  Constitutional:  Negative for chills and fever.  HENT: Negative.    Respiratory: Negative.    Cardiovascular: Negative.   Gastrointestinal: Negative.   Musculoskeletal: Negative.   Skin: Negative.   Neurological:        See HPI.   Physical Exam Updated Vital Signs BP 135/88 (BP Location: Right Arm)    Pulse (!) 57    Temp 97.8 F (36.6 C) (Oral)    Resp 18    SpO2 95%   Physical Exam Constitutional:      Appearance: She is well-developed.  HENT:     Head: Normocephalic.  Cardiovascular:     Rate and Rhythm: Normal rate and regular rhythm.     Heart sounds: No murmur heard. Pulmonary:     Effort: Pulmonary effort is normal.     Breath sounds: Normal breath sounds. No wheezing, rhonchi or rales.  Abdominal:     General: Bowel sounds are normal.     Palpations: Abdomen is soft.     Tenderness: There is no abdominal tenderness. There is no guarding or rebound.  Musculoskeletal:        General: Normal range of motion.     Cervical back: Normal range of motion and neck supple.  Skin:    General: Skin is warm and dry.  Neurological:     General: No focal deficit present.     Mental Status: She is alert and oriented to person, place, and time.     GCS: GCS eye subscore is 4. GCS verbal subscore is 5. GCS motor subscore is 6.     Cranial Nerves: No cranial nerve deficit, dysarthria or facial asymmetry.     Sensory: No sensory deficit.     Motor: No weakness, abnormal muscle tone or pronator drift.     Coordination: Romberg sign negative. Coordination  normal.     Gait: Gait is intact. Gait normal.     Deep Tendon Reflexes:     Reflex Scores:      Bicep reflexes are 3+ on the right side and 3+ on the left side.      Patellar reflexes are 3+ on the right side and 3+ on the left side.    Comments: Patient observed while sitting up, engaged in conversation, slowly falls to right, voluntarily corrects,  symptoms repeat. While walking, gait is normal, observed to walk and reverse direction without losing balance, however, right shoulder lower than left. Normal/symmetric shrug.    ED Results / Procedures / Treatments   Labs (all labs ordered are listed, but only abnormal results are displayed) Labs Reviewed  CBC - Abnormal; Notable for the following components:      Result Value   WBC 3.0 (*)    RBC 3.39 (*)    Hemoglobin 11.1 (*)    HCT 32.7 (*)    Platelets 124 (*)    All other components within normal limits  DIFFERENTIAL - Abnormal; Notable for the following components:   Neutro Abs 1.5 (*)    All other components within normal limits  COMPREHENSIVE METABOLIC PANEL - Abnormal; Notable for the following components:   Potassium 3.2 (*)    Glucose, Bld 124 (*)    Calcium 8.7 (*)    Anion gap 4 (*)    All other components within normal limits  RESP PANEL BY RT-PCR (FLU A&B, COVID) ARPGX2  PROTIME-INR  APTT  ETHANOL  RAPID URINE DRUG SCREEN, HOSP PERFORMED  URINALYSIS, ROUTINE W REFLEX MICROSCOPIC    EKG EKG Interpretation  Date/Time:  Monday November 08 2021 15:15:00 EST Ventricular Rate:  71 PR Interval:  119 QRS Duration: 98 QT Interval:  402 QTC Calculation: 437 R Axis:   82 Text Interpretation: Sinus rhythm Borderline short PR interval Biatrial enlargement Anteroseptal infarct, age indeterminate Baseline wander in lead(s) V3 since last tracing no significant change Confirmed by Mancel Bale [phone]) on 11/08/2021 3:45:07 PM  Radiology CT HEAD WO CONTRAST  Result Date: 11/08/2021 CLINICAL DATA:  Dizziness.   Unsteady on feet. EXAM: CT HEAD WITHOUT CONTRAST TECHNIQUE: Contiguous axial images were obtained from the base of the skull through the vertex without intravenous contrast. COMPARISON:  None. FINDINGS: Brain: No evidence of acute infarction, hemorrhage, hydrocephalus, extra-axial collection or mass lesion/mass effect. Calcified, para falcine extra-axial lesion overlying the left cerebral convexity measures 6 mm and is favored to represent a small meningioma, image 22/5. Vascular: No hyperdense vessel or unexpected calcification. Skull: Normal. Negative for fracture or focal lesion. Sinuses/Orbits: Paranasal sinuses and mastoid air cells are clear. Normal appearance of the orbits. Other: None IMPRESSION: 1. No acute intracranial abnormalities. 2. 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 Electronically Signed   By: Signa Kell M.D.   On: 11/08/2021 16:06    Procedures Procedures   Medications Ordered in ED Medications - No data to display  ED Course  I have reviewed the triage vital signs and the nursing notes.  Pertinent labs & imaging results that were available during my care of the patient were reviewed by me and considered in my medical decision making (see chart for details).    MDM Rules/Calculators/A&P                         Patient to ED with family concerned about symptoms as described in the HPI of leaning to the right.   Very pleasant patient with grossly normal exam, leaning while sitting which she corrects periodically, right upper body slumping while walking, normal gait, normal shrug strength.  Normal coordination.   Head CT negative. The patient will need an MRI. Discussed outpatient study and ambulatory referral to neurology but given patient's reluctance to seek medical help, feel keeping her in the ED to have the study in the morning, with appropriate inpatient or outpatient  consult would be best for her. She is in agreement to stay. Son  at bedside. All questions answered. Nighttime medication provided. Offered a nicotine patch which she declines.   Patient care will be signed out to oncoming day team.      Final Clinical Impression(s) / ED Diagnoses Final diagnoses:  None   R/o stroke  Rx / DC Orders ED Discharge Orders     None        Elpidio Anis, PA-C 11/09/21 0431    Gwyneth Sprout, MD 11/09/21 [phone]

## 2021-11-09 NOTE — ED Provider Notes (Signed)
Care assumed from Elpidio Anis, PA-C at shift change pending MRI. See her note for full HPI.  In short, patient is a 60 year old female who presents to the ED due to balance issues and leaning towards her right both while sitting and with ambulation.  Symptoms started roughly 2 days ago.  No visual changes, speech changes, or dizziness.  No previous CVA.  No headache.  Plan from previous provider: If MRI negative, patient may be discharged home with neurology referral.   ED Course/Procedures    Results for orders placed or performed during the hospital encounter of 11/08/21 (from the past 24 hour(s))  Protime-INR     Status: None   Collection Time: 11/08/21  3:30 PM  Result Value Ref Range   Prothrombin Time 14.4 11.4 - 15.2 seconds   INR 1.1 0.8 - 1.2  APTT     Status: None   Collection Time: 11/08/21  3:30 PM  Result Value Ref Range   aPTT 27 24 - 36 seconds  CBC     Status: Abnormal   Collection Time: 11/08/21  3:30 PM  Result Value Ref Range   WBC 3.0 (L) 4.0 - 10.5 K/uL   RBC 3.39 (L) 3.87 - 5.11 MIL/uL   Hemoglobin 11.1 (L) 12.0 - 15.0 g/dL   HCT 60.1 (L) 09.3 - 23.5 %   MCV 96.5 80.0 - 100.0 fL   MCH 32.7 26.0 - 34.0 pg   MCHC 33.9 30.0 - 36.0 g/dL   RDW 57.3 22.0 - 25.4 %   Platelets 124 (L) 150 - 400 K/uL   nRBC 0.0 0.0 - 0.2 %  Differential     Status: Abnormal   Collection Time: 11/08/21  3:30 PM  Result Value Ref Range   Neutrophils Relative % 50 %   Neutro Abs 1.5 (L) 1.7 - 7.7 K/uL   Lymphocytes Relative 43 %   Lymphs Abs 1.3 0.7 - 4.0 K/uL   Monocytes Relative 6 %   Monocytes Absolute 0.2 0.1 - 1.0 K/uL   Eosinophils Relative 1 %   Eosinophils Absolute 0.0 0.0 - 0.5 K/uL   Basophils Relative 0 %   Basophils Absolute 0.0 0.0 - 0.1 K/uL   Immature Granulocytes 0 %   Abs Immature Granulocytes 0.00 0.00 - 0.07 K/uL  Comprehensive metabolic panel     Status: Abnormal   Collection Time: 11/08/21  3:30 PM  Result Value Ref Range   Sodium 136 135 - 145 mmol/L    Potassium 3.2 (L) 3.5 - 5.1 mmol/L   Chloride 105 98 - 111 mmol/L   CO2 27 22 - 32 mmol/L   Glucose, Bld 124 (H) 70 - 99 mg/dL   BUN 8 8 - 23 mg/dL   Creatinine, Ser 2.70 0.44 - 1.00 mg/dL   Calcium 8.7 (L) 8.9 - 10.3 mg/dL   Total Protein 8.0 6.5 - 8.1 g/dL   Albumin 3.9 3.5 - 5.0 g/dL   AST 16 15 - 41 U/L   ALT 12 0 - 44 U/L   Alkaline Phosphatase 40 38 - 126 U/L   Total Bilirubin 0.6 0.3 - 1.2 mg/dL   GFR, Estimated >62 >37 mL/min   Anion gap 4 (L) 5 - 15  Ethanol     Status: None   Collection Time: 11/08/21  3:30 PM  Result Value Ref Range   Alcohol, Ethyl (B) <10 <10 mg/dL  Resp Panel by RT-PCR (Flu A&B, Covid) Nasopharyngeal Swab     Status: None  Collection Time: 11/08/21  3:32 PM   Specimen: Nasopharyngeal Swab; Nasopharyngeal(NP) swabs in vial transport medium  Result Value Ref Range   SARS Coronavirus 2 by RT PCR NEGATIVE NEGATIVE   Influenza A by PCR NEGATIVE NEGATIVE   Influenza B by PCR NEGATIVE NEGATIVE  Urine rapid drug screen (hosp performed)     Status: Abnormal   Collection Time: 11/08/21 11:30 PM  Result Value Ref Range   Opiates NONE DETECTED NONE DETECTED   Cocaine NONE DETECTED NONE DETECTED   Benzodiazepines NONE DETECTED NONE DETECTED   Amphetamines NONE DETECTED NONE DETECTED   Tetrahydrocannabinol POSITIVE (A) NONE DETECTED   Barbiturates NONE DETECTED NONE DETECTED  Urinalysis, Routine w reflex microscopic Urine, Clean Catch     Status: Abnormal   Collection Time: 11/08/21 11:30 PM  Result Value Ref Range   Color, Urine YELLOW YELLOW   APPearance CLEAR CLEAR   Specific Gravity, Urine 1.014 1.005 - 1.030   pH 6.0 5.0 - 8.0   Glucose, UA NEGATIVE NEGATIVE mg/dL   Hgb urine dipstick SMALL (A) NEGATIVE   Bilirubin Urine NEGATIVE NEGATIVE   Ketones, ur NEGATIVE NEGATIVE mg/dL   Protein, ur NEGATIVE NEGATIVE mg/dL   Nitrite NEGATIVE NEGATIVE   Leukocytes,Ua SMALL (A) NEGATIVE   RBC / HPF 6-10 0 - 5 RBC/hpf   WBC, UA 0-5 0 - 5 WBC/hpf    Bacteria, UA RARE (A) NONE SEEN   Squamous Epithelial / LPF 0-5 0 - 5   Mucus PRESENT     Procedures  MDM  Care assumed from Elpidio Anis, PA-C at shift change pending MRI. See her note for full MDM.  MRI personally reviewed which demonstrates: IMPRESSION:  No acute intracranial abnormality.     Essentially normal for age noncontrast MRI appearance of the brain;  a 4-5 mm left para falcine lesion is indeterminate for a dural  calcification versus tiny meningioma.      Patient made aware about calcification vs. Meningioma. No evidence of CVA. Ambulatory neurology referral placed. Patient stable for discharge. Strict ED precautions discussed with patient. Patient states understanding and agrees to plan. Patient discharged home in no acute distress and stable vitals.        Jesusita Oka 11/09/21 [id number]    Gwyneth Sprout, MD 11/09/21 [id number]

## 2021-11-09 NOTE — Discharge Instructions (Signed)
It was a pleasure taking care of you today. As discussed, your MRI did not show any signs of a stroke. It did show either a calcification or meningioma. I have placed a referral to neurology. Expect a phone call within the next week to schedule an appointment. I have also included the number of the neurology office. Return to the ER for new or worsening symptoms.

## 2021-12-14 ENCOUNTER — Encounter: Payer: Self-pay | Admitting: Neurology

## 2021-12-14 ENCOUNTER — Ambulatory Visit: Payer: Self-pay | Admitting: Neurology

## 2021-12-31 ENCOUNTER — Telehealth (HOSPITAL_COMMUNITY): Payer: No Typology Code available for payment source | Admitting: Psychiatry

## 2021-12-31 ENCOUNTER — Encounter (HOSPITAL_COMMUNITY): Payer: Self-pay

## 2022-02-07 ENCOUNTER — Other Ambulatory Visit: Payer: Self-pay

## 2022-02-07 ENCOUNTER — Telehealth (HOSPITAL_COMMUNITY): Payer: No Typology Code available for payment source | Admitting: Psychiatry

## 2022-02-07 ENCOUNTER — Encounter (HOSPITAL_COMMUNITY): Payer: Self-pay

## 2022-02-24 ENCOUNTER — Telehealth (HOSPITAL_COMMUNITY): Payer: No Typology Code available for payment source | Admitting: Psychiatry

## 2022-02-24 ENCOUNTER — Encounter (HOSPITAL_COMMUNITY): Payer: Self-pay

## 2022-05-18 ENCOUNTER — Other Ambulatory Visit (HOSPITAL_COMMUNITY): Payer: Self-pay | Admitting: Psychiatry

## 2022-05-18 DIAGNOSIS — F418 Other specified anxiety disorders: Secondary | ICD-10-CM

## 2022-06-23 ENCOUNTER — Encounter (HOSPITAL_COMMUNITY): Payer: Self-pay | Admitting: Psychiatry

## 2022-06-23 ENCOUNTER — Telehealth (INDEPENDENT_AMBULATORY_CARE_PROVIDER_SITE_OTHER): Payer: No Payment, Other | Admitting: Psychiatry

## 2022-06-23 DIAGNOSIS — F418 Other specified anxiety disorders: Secondary | ICD-10-CM

## 2022-06-23 MED ORDER — ARIPIPRAZOLE 10 MG PO TABS: 10.0000 mg | ORAL_TABLET | Freq: Every day | ORAL | 3 refills | Status: DC

## 2022-06-23 MED ORDER — TRAZODONE HCL 150 MG PO TABS: 150.0000 mg | ORAL_TABLET | Freq: Every day | ORAL | 3 refills | Status: DC

## 2022-06-23 MED ORDER — CLONAZEPAM 0.5 MG PO TABS: 0.5000 mg | ORAL_TABLET | Freq: Two times a day (BID) | ORAL | 3 refills | Status: DC

## 2022-06-23 MED ORDER — SERTRALINE HCL 100 MG PO TABS: 100.0000 mg | ORAL_TABLET | Freq: Every day | ORAL | 3 refills | Status: DC

## 2022-06-23 NOTE — Progress Notes (Signed)
BH MD/PA/NP OP Progress Note Virtual Visit via Telephone Note  I connected with Michele Black on 06/23/22 at  9:00 AM EDT by telephone and verified that I am speaking with the correct person using two identifiers.  Location: Patient: home Provider: Clinic   I discussed the limitations, risks, security and privacy concerns of performing an evaluation and management service by telephone and the availability of in person appointments. I also discussed with the patient that there may be a patient responsible charge related to this service. The patient expressed understanding and agreed to proceed.   I provided 30 minutes of non-face-to-face time during this encounter.  06/23/2022 9:18 AM [name]  MRN:  [id number]  Chief Complaint: "I needs my medicine"  HPI: 61 year old female seen today for follow up psychiatric evaluation. She has a psychiatric history of anxiety, depression, and tobacco dependence. She is currently managed on Abilify 5 mg daily, Klonopin 0.5 mg twice daily as needed, Trazodone 100 mg nightly as needed, and Zoloft 100 mg daily. She notes her medications are somewhat effective in managing her psychiatric conditions.  Today she was unable to logon virtually so assessment was done over the phone.  During exam she was pleasant, cooperative, engaged in conversation.  She informed Clinical research associate that he has been without her medication for 5 days.  Patient notes that she has been more anxious and depressed without her medications.  Provider also conducted PHQ-9 and patient scored a 21. Provider also conducted a PHQ 9 and patient scored an 18.  She notes that her appetite continues to be poor and reports that she has lost over 10 pounds.  She endorses adequate sleep. Today she endorses passive SI but denies wanting to harm herself. Today she denies SI/HI/VAH or mania. At times she reports feeling paranoid.   Patient informed Clinical research associate that she no longer smokes cigarettes and has been without them  for a month. She denies alcohol or illegal drug use.   Today she is agreeable to increase Abilify 5 mg to 10 mg to help manage mood. She is also agreeable to increase Zoloft 100 mg to 150 mg to help manage anxiety and depression. Provider informed patient that klonopin would be reduced at her next visit. No other concerns noted at this time.  Visit Diagnosis:    ICD-10-CM   1. Depression with anxiety  F41.8 ARIPiprazole (ABILIFY) 10 MG tablet    sertraline (ZOLOFT) 100 MG tablet    traZODone (DESYREL) 150 MG tablet      Past Psychiatric History: anxiety, depression, and tobacco dependence.  Past Medical History:  Past Medical History:  Diagnosis Date   Acute myocardial infarction of other anterior wall, initial episode of care    Anxiety    Arthritis    Ischemic cardiomyopathy    Mixed hyperlipidemia    Tobacco use disorder     Past Surgical History:  Procedure Laterality Date   LEFT HEART CATHETERIZATION WITH CORONARY ANGIOGRAM Bilateral 09/05/2013   Procedure: LEFT HEART CATHETERIZATION WITH CORONARY ANGIOGRAM;  Surgeon: Corky Crafts, MD;  Location[facility] CATH LAB;  Service: Cardiovascular;  Laterality: Bilateral;   PERCUTANEOUS CORONARY STENT INTERVENTION (PCI-S)  09/05/2013   Procedure: PERCUTANEOUS CORONARY STENT INTERVENTION (PCI-S);  Surgeon: Corky Crafts, MD;  Location: [facility] CATH LAB;  Service: Cardiovascular;;  DES to mid LAD    Family Psychiatric History: anxiety- mom  Family History:  Family History  Problem Relation Age of Onset   Throat cancer Mother  Heart attack Neg Hx     Social History:  Social History   Socioeconomic History   Marital status: Single    Spouse name: Not on file   Number of children: Not on file   Years of education: Not on file   Highest education level: Not on file  Occupational History   Not on file  Tobacco Use   Smoking status: Every Day    Packs/day: 1.00    Types: Cigarettes    Last attempt to quit: 09/05/2013     Years since quitting: 8.8   Smokeless tobacco: Never  Substance and Sexual Activity   Alcohol use: Yes    Alcohol/week: 1.0 standard drink of alcohol    Types: 1 Shots of liquor per week   Drug use: No   Sexual activity: Not on file  Other Topics Concern   Not on file  Social History Narrative   Not on file   Social Determinants of Health   Financial Resource Strain: Not on file  Food Insecurity: Not on file  Transportation Needs: Not on file  Physical Activity: Not on file  Stress: Not on file  Social Connections: Not on file    Allergies: No Known Allergies  Metabolic Disorder Labs: No results found for: "HGBA1C", "MPG" No results found for: "PROLACTIN" Lab Results  Component Value Date   CHOL 131 08/08/2016   TRIG 68 08/08/2016   HDL 38 (L) 08/08/2016   CHOLHDL 3.4 08/08/2016   VLDL 14 08/08/2016   LDLCALC 79 08/08/2016   LDLCALC 83 02/19/2014   No results found for: "TSH"  Therapeutic Level Labs: No results found for: "LITHIUM" No results found for: "VALPROATE" No results found for: "CBMZ"  Current Medications: Current Outpatient Medications  Medication Sig Dispense Refill   ARIPiprazole (ABILIFY) 10 MG tablet Take 1 tablet (10 mg total) by mouth daily. 30 tablet 3   aspirin 81 MG chewable tablet Chew 1 tablet (81 mg total) by mouth daily.     atorvastatin (LIPITOR) 40 MG tablet TAKE 1 TABLET BY MOUTH DAILY AT 6 PM. 30 tablet 2   clonazePAM (KLONOPIN) 0.5 MG tablet Take 1 tablet (0.5 mg total) by mouth 2 (two) times daily. 60 tablet 3   clopidogrel (PLAVIX) 75 MG tablet TAKE 1 TABLET BY MOUTH DAILY. 90 tablet 1   furosemide (LASIX) 20 MG tablet TAKE 1 TABLET BY MOUTH 2 TIMES DAILY AS NEEDED FOR FLUID OR EDEMA (SHORTNESS OF BREATH). 60 tablet 2   lisinopril (PRINIVIL,ZESTRIL) 5 MG tablet TAKE 1 TABLET BY MOUTH DAILY. 30 tablet 2   metoprolol tartrate (LOPRESSOR) 25 MG tablet TAKE 1/2 TABLET BY MOUTH 2 TIMES DAILY. 30 tablet 2   nitroGLYCERIN (NITROSTAT)  0.4 MG SL tablet Place 1 tablet (0.4 mg total) under the tongue every 5 (five) minutes as needed for chest pain. 3 DOSES MAX 25 tablet 3   ondansetron (ZOFRAN ODT) 4 MG disintegrating tablet Take 1 tablet (4 mg total) by mouth every 8 (eight) hours as needed for nausea or vomiting. 20 tablet 0   potassium chloride SA (KLOR-CON M20) 20 MEQ tablet Take 2 tablets (40 mEq total) by mouth daily. (Patient taking differently: Take 40 mEq by mouth daily as needed (when taking Lasix). ) 90 tablet 1   sertraline (ZOLOFT) 100 MG tablet Take 1 tablet (100 mg total) by mouth daily. 30 tablet 3   traZODone (DESYREL) 150 MG tablet Take 1 tablet (150 mg total) by mouth at bedtime. 30 tablet 3  No current facility-administered medications for this visit.     Musculoskeletal: Strength & Muscle Tone:  Unable to assess due to telephone visit Gait & Station:  Unable to assess due to telephone visit Patient leans: N/A  Psychiatric Specialty Exam: Review of Systems  There were no vitals taken for this visit.There is no height or weight on file to calculate BMI.  General Appearance:  Unable to assess due to telephone visit  Eye Contact:   Unable to assess due to telephone visit  Speech:  Clear and coherant  Volume:  Normal  Mood:  Anxious and Depressed  Affect:  Appropriate and Congruent  Thought Process:  Coherent, Goal Directed, and Linear  Orientation:  Full (Time, Place, and Person)  Thought Content: WDL and Logical   Suicidal Thoughts:  Yes.  without intent/plan  Homicidal Thoughts:  No  Memory:  Immediate;   Good Recent;   Good Remote;   Good  Judgement:  Good  Insight:  Good  Psychomotor Activity:   Able to assess due to telephone visit  Concentration:  Concentration: Good and Attention Span: Good  Recall:  Good  Fund of Knowledge: Good  Language: Good  Akathisia:  Unable to assess due to telephone visit  Handed:  Right  AIMS (if indicated): not done  Assets:  Communication Skills Desire  for Improvement Financial Resources/Insurance Housing Physical Health Social Support  ADL's:  Intact  Cognition: WNL  Sleep:  Good   Screenings: GAD-7    Flowsheet Row Video Visit from 06/23/2022 in [facility] Video Visit from 10/21/2021 in [facility] Video Visit from 07/23/2021 in [facility]  Total GAD-7 Score 18 6 16       PHQ2-9    Flowsheet Row Video Visit from 06/23/2022 in [facility] Video Visit from 10/21/2021 in [facility] Video Visit from 07/23/2021 in [facility]  PHQ-2 Total Score 4 3 1   PHQ-9 Total Score 21 7 5       Flowsheet Row Video Visit from 06/23/2022 in [facility] ED from 11[facility]08/2021 in [facility]  C-SSRS RISK CATEGORY Low Risk No Risk        Assessment and Plan: Patient endorses symptoms of anxiety and depression. Today she is agreeable to increase Abilify 5 mg to 10 mg to help manage mood. She is also agreeable to increase Zoloft 100 mg to 150 mg to help manage anxiety and depression. Provider informed patient that klonopin would be reduced at her next visit.  1. Depression with anxiety  Continue- ARIPiprazole (ABILIFY) 5 MG tablet; Take 1 tablet (5 mg total) by mouth daily.  Dispense: 30 tablet; Refill: 3 Continue- clonazePAM (KLONOPIN) 0.5 MG tablet; Take 1 tablet (0.5 mg total) by mouth 2 (two) times daily.  Dispense: 60 tablet; Refill: 3 Continue- sertraline (ZOLOFT) 100 MG tablet; Take 1 tablet (100 mg total) by mouth daily.  Dispense: 30 tablet; Refill: 3 Continue- traZODone (DESYREL) 100 MG tablet; Take 1 tablet (100 mg total) by mouth at bedtime.  Dispense: 30 tablet; Refill: 3  Follow up in 3 months   08/23/2022, NP 06/23/2022, 9:18 AM

## 2022-09-14 ENCOUNTER — Encounter (HOSPITAL_COMMUNITY): Payer: Self-pay | Admitting: Psychiatry

## 2022-09-14 ENCOUNTER — Telehealth (INDEPENDENT_AMBULATORY_CARE_PROVIDER_SITE_OTHER): Payer: No Payment, Other | Admitting: Psychiatry

## 2022-09-14 DIAGNOSIS — F418 Other specified anxiety disorders: Secondary | ICD-10-CM

## 2022-09-14 MED ORDER — ARIPIPRAZOLE 10 MG PO TABS: 10.0000 mg | ORAL_TABLET | Freq: Every day | ORAL | 3 refills | Status: DC

## 2022-09-14 MED ORDER — SERTRALINE HCL 100 MG PO TABS: 100.0000 mg | ORAL_TABLET | Freq: Every day | ORAL | 3 refills | Status: DC

## 2022-09-14 MED ORDER — TRAZODONE HCL 150 MG PO TABS: 150.0000 mg | ORAL_TABLET | Freq: Every day | ORAL | 3 refills | Status: DC

## 2022-09-14 MED ORDER — CLONAZEPAM 0.5 MG PO TABS: 0.5000 mg | ORAL_TABLET | Freq: Every day | ORAL | 2 refills | Status: DC

## 2022-09-14 NOTE — Progress Notes (Signed)
BH MD/PA/NP OP Progress Note Virtual Visit via Telephone Note  I connected with Michele Black on 09/14/22 at  9:00 AM EDT by telephone and verified that I am speaking with the correct person using two identifiers.  Location: Patient: home Provider: Clinic   I discussed the limitations, risks, security and privacy concerns of performing an evaluation and management service by telephone and the availability of in person appointments. I also discussed with the patient that there may be a patient responsible charge related to this service. The patient expressed understanding and agreed to proceed.   I provided 30 minutes of non-face-to-face time during this encounter.   09/14/2022 9:26 AM Michele Black  MRN:  [id number]  Chief Complaint: "Things are better"  HPI: 61 year old female seen today for follow up psychiatric evaluation. She has a psychiatric history of anxiety, depression, and tobacco dependence. She is currently managed on Abilify 10 mg daily, Klonopin 0.5 mg twice daily as needed, Trazodone 100 mg nightly as needed, and Zoloft 150 mg daily. She notes her medications are effective in managing her psychiatric conditions.  Today she was well-groomed, pleasant, cooperative, engaged in conversation, and maintained eye contact.  She informed Clinical research associate that things are better since Abilify and Zoloft were increased.  She informed Clinical research associate that she is less anxious and depressed.  She reports her mood is stable and denies symptoms of psychosis.  Today provider conducted a GAD-7 and patient scored a 0, at her last visit she scored 18.  Provider also conducted PHQ-9 patient scored a 0, at her last visit she scored a 21.  She endorses adequate sleep and appetite.  Today she denies SI/HI/VAH, mania, or paranoia.   Klonopin reduced to 0.5 mg daily.  Patient also informed that Klonopin would be discontinued in the near future.  She endorsed understanding and agreed.  Today she will continue all other  medications as prescribed.  No other concerns noted at this time.    Visit Diagnosis:    ICD-10-CM   1. Depression with anxiety  F41.8 ARIPiprazole (ABILIFY) 10 MG tablet    sertraline (ZOLOFT) 100 MG tablet    traZODone (DESYREL) 150 MG tablet    clonazePAM (KLONOPIN) 0.5 MG tablet      Past Psychiatric History: anxiety, depression, and tobacco dependence.  Past Medical History:  Past Medical History:  Diagnosis Date   Acute myocardial infarction of other anterior wall, initial episode of care    Anxiety    Arthritis    Ischemic cardiomyopathy    Mixed hyperlipidemia    Tobacco use disorder     Past Surgical History:  Procedure Laterality Date   LEFT HEART CATHETERIZATION WITH CORONARY ANGIOGRAM Bilateral 09/05/2013   Procedure: LEFT HEART CATHETERIZATION WITH CORONARY ANGIOGRAM;  Surgeon: Corky Crafts, MD;  Location: [facility] CATH LAB;  Service: Cardiovascular;  Laterality: Bilateral;   PERCUTANEOUS CORONARY STENT INTERVENTION (PCI-S)  09/05/2013   Procedure: PERCUTANEOUS CORONARY STENT INTERVENTION (PCI-S);  Surgeon: Corky Crafts, MD;  Location: [facility] CATH LAB;  Service: Cardiovascular;;  DES to mid LAD    Family Psychiatric History: anxiety- mom  Family History:  Family History  Problem Relation Age of Onset   Throat cancer Mother    Heart attack Neg Hx     Social History:  Social History   Socioeconomic History   Marital status: Single    Spouse name: Not on file   Number of children: Not on file   Years of education: Not on file  Highest education level: Not on file  Occupational History   Not on file  Tobacco Use   Smoking status: Every Day    Packs/day: 1.00    Types: Cigarettes    Last attempt to quit: 09/05/2013    Years since quitting: 9.0   Smokeless tobacco: Never  Substance and Sexual Activity   Alcohol use: Yes    Alcohol/week: 1.0 standard drink of alcohol    Types: 1 Shots of liquor per week   Drug use: No   Sexual activity: Not on  file  Other Topics Concern   Not on file  Social History Narrative   Not on file   Social Determinants of Health   Financial Resource Strain: Not on file  Food Insecurity: Not on file  Transportation Needs: Not on file  Physical Activity: Not on file  Stress: Not on file  Social Connections: Not on file    Allergies: No Known Allergies  Metabolic Disorder Labs: No results found for: "HGBA1C", "MPG" No results found for: "PROLACTIN" Lab Results  Component Value Date   CHOL 131 08/08/2016   TRIG 68 08/08/2016   HDL 38 (L) 08/08/2016   CHOLHDL 3.4 08/08/2016   VLDL 14 08/08/2016   LDLCALC 79 08/08/2016   [place] 83 02/19/2014   No results found for: "TSH"  Therapeutic Level Labs: No results found for: "LITHIUM" No results found for: "VALPROATE" No results found for: "CBMZ"  Current Medications: Current Outpatient Medications  Medication Sig Dispense Refill   ARIPiprazole (ABILIFY) 10 MG tablet Take 1 tablet (10 mg total) by mouth daily. 30 tablet 3   aspirin 81 MG chewable tablet Chew 1 tablet (81 mg total) by mouth daily.     atorvastatin (LIPITOR) 40 MG tablet TAKE 1 TABLET BY MOUTH DAILY AT 6 PM. 30 tablet 2   clonazePAM (KLONOPIN) 0.5 MG tablet Take 1 tablet (0.5 mg total) by mouth daily. 30 tablet 2   clopidogrel (PLAVIX) 75 MG tablet TAKE 1 TABLET BY MOUTH DAILY. 90 tablet 1   furosemide (LASIX) 20 MG tablet TAKE 1 TABLET BY MOUTH 2 TIMES DAILY AS NEEDED FOR FLUID OR EDEMA (SHORTNESS OF BREATH). 60 tablet 2   lisinopril (PRINIVIL,ZESTRIL) 5 MG tablet TAKE 1 TABLET BY MOUTH DAILY. 30 tablet 2   metoprolol tartrate (LOPRESSOR) 25 MG tablet TAKE 1/2 TABLET BY MOUTH 2 TIMES DAILY. 30 tablet 2   nitroGLYCERIN (NITROSTAT) 0.4 MG SL tablet Place 1 tablet (0.4 mg total) under the tongue every 5 (five) minutes as needed for chest pain. 3 DOSES MAX 25 tablet 3   ondansetron (ZOFRAN ODT) 4 MG disintegrating tablet Take 1 tablet (4 mg total) by mouth every 8 (eight) hours as  needed for nausea or vomiting. 20 tablet 0   potassium chloride SA (KLOR-CON M20) 20 MEQ tablet Take 2 tablets (40 mEq total) by mouth daily. (Patient taking differently: Take 40 mEq by mouth daily as needed (when taking Lasix). ) 90 tablet 1   sertraline (ZOLOFT) 100 MG tablet Take 1 tablet (100 mg total) by mouth daily. 30 tablet 3   traZODone (DESYREL) 150 MG tablet Take 1 tablet (150 mg total) by mouth at bedtime. 30 tablet 3   No current facility-administered medications for this visit.     Musculoskeletal: Strength & Muscle Tone: within normal limits and telehealth visit Gait & Station: normal, telehealth visit Patient leans: N/A  Psychiatric Specialty Exam: Review of Systems  There were no vitals taken for this visit.There is no  height or weight on file to calculate BMI.  General Appearance: Well Groomed  Eye Contact:  Good  Speech:  Clear and coherant  Volume:  Normal  Mood:  Euthymic  Affect:  Appropriate and Congruent  Thought Process:  Coherent, Goal Directed, and Linear  Orientation:  Full (Time, Place, and Person)  Thought Content: WDL and Logical   Suicidal Thoughts:  No  Homicidal Thoughts:  No  Memory:  Immediate;   Good Recent;   Good Remote;   Good  Judgement:  Good  Insight:  Good  Psychomotor Activity:  Normal  Concentration:  Concentration: Good and Attention Span: Good  Recall:  Good  Fund of Knowledge: Good  Language: Good  Akathisia:  NA  Handed:  Right  AIMS (if indicated): not done  Assets:  Communication Skills Desire for Improvement Financial Resources/Insurance Housing Physical Health Social Support  ADL's:  Intact  Cognition: WNL  Sleep:  Good   Screenings: GAD-7    Flowsheet Row Video Visit from 09/14/2022 in [facility] Video Visit from 06/23/2022 in [facility] Video Visit from 10/21/2021 in [facility] Video Visit from 07/23/2021 in [facility]  Total GAD-7 Score 0 18 6 16       PHQ2-9    Flowsheet Row Video Visit from 09/14/2022 in [facility] Video Visit from 06/23/2022 in [facility] Video Visit from 10/21/2021 in [facility] Video Visit from 07/23/2021 in [facility]  PHQ-2 Total Score 0 4 3 1   PHQ-9 Total Score 0 21 7 5       Flowsheet Row Video Visit from 06/23/2022 in [facility] ED from 11/08/2021 in [facility]  C-SSRS RISK CATEGORY Low Risk No Risk        Assessment and Plan: Patient reports that her anxiety and depression has improved since her last visit.  Today Klonopin reduced to 0.5 mg daily.  Patient also informed that Klonopin would be discontinued in the near future.  She endorsed understanding and agreed.  Today she will continue all other medications as prescribed.  1. Depression with anxiety  Continue- ARIPiprazole (ABILIFY) 10 MG tablet; Take 1 tablet (10 mg total) by mouth daily.  Dispense: 30 tablet; Refill: 3 Continue- sertraline (ZOLOFT) 100 MG tablet; Take 1 tablet (100 mg total) by mouth daily.  Dispense: 30 tablet; Refill: 3 Continue- traZODone (DESYREL) 150 MG tablet; Take 1 tablet (150 mg total) by mouth at bedtime.  Dispense: 30 tablet; Refill: 3 Reduced- clonazePAM (KLONOPIN) 0.5 MG tablet; Take 1 tablet (0.5 mg total) by mouth daily.  Dispense: 30 tablet; Refill: 2  Follow up in 3 months   08/23/2022, NP 09/14/2022, 9:26 AM

## 2022-12-16 ENCOUNTER — Ambulatory Visit (INDEPENDENT_AMBULATORY_CARE_PROVIDER_SITE_OTHER): Payer: No Payment, Other | Admitting: Student in an Organized Health Care Education/Training Program

## 2022-12-16 ENCOUNTER — Encounter (HOSPITAL_COMMUNITY): Payer: Self-pay | Admitting: Student in an Organized Health Care Education/Training Program

## 2022-12-16 DIAGNOSIS — F418 Other specified anxiety disorders: Secondary | ICD-10-CM | POA: Diagnosis not present

## 2022-12-16 MED ORDER — ARIPIPRAZOLE 10 MG PO TABS: 10.0000 mg | ORAL_TABLET | Freq: Every day | ORAL | 3 refills | Status: DC

## 2022-12-16 MED ORDER — TRAZODONE HCL 150 MG PO TABS: 150.0000 mg | ORAL_TABLET | Freq: Every day | ORAL | 3 refills | Status: DC

## 2022-12-16 MED ORDER — SERTRALINE HCL 100 MG PO TABS: 150.0000 mg | ORAL_TABLET | Freq: Every day | ORAL | 3 refills | Status: DC

## 2022-12-16 NOTE — Progress Notes (Signed)
BH MD/PA/NP OP Progress Note  Virtual Visit via Video Note  I connected with Michele Black on 12/16/22 at  8:30 AM EST by a video enabled telemedicine application and verified that I am speaking with the correct person using two identifiers.  Location: Patient: Work Provider: [facility]   I discussed the limitations of evaluation and management by telemedicine and the availability of in person appointments. The patient expressed understanding and agreed to proceed.  12/16/2022 8:57 AM Michele Black  MRN:  [id number]  Chief Complaint:  Chief Complaint  Patient presents with   Follow-up   Anxiety   Depression   HPI:  Michele Black is a 62 yr old female who presents via Virtual Video Visit for Follow Up and Medication Management.  PPHx is significant for MDD and Anxiety.   She reports that things have continued to be relatively okay.  She reports that her depression and anxiety are well-controlled by her medications for the most part.  She reports that she still does have significant anxiety when driving in a car with anyone.  She reports that he gets so severe she has had thoughts of grabbing the steering wheel at times.  Discussed further increasing her Zoloft and she was agreeable to this.  She reports that she is tolerated the reduction of her Klonopin.  Discussed with her that since she still has a refill left to call for further refills when needed.  Discussed with her that at follow-up appointment we would continue tapering off and she was agreeable with this.  She reports no side effects with her medications.  She reports no SI, HI, or AVH.  She reports her sleep is good.  She reports her appetite is fair.  She reports no other concerns at present.  She will return for follow-up in approximately 3 months.   Visit Diagnosis:    ICD-10-CM   1. Depression with anxiety  F41.8 sertraline (ZOLOFT) 100 MG tablet    ARIPiprazole (ABILIFY) 10 MG tablet    traZODone (DESYREL) 150 MG tablet      Past  Psychiatric History: MDD and Anxiety.  Past Medical History:  Past Medical History:  Diagnosis Date   Acute myocardial infarction of other anterior wall, initial episode of care    Anxiety    Arthritis    Ischemic cardiomyopathy    Mixed hyperlipidemia    Tobacco use disorder     Past Surgical History:  Procedure Laterality Date   LEFT HEART CATHETERIZATION WITH CORONARY ANGIOGRAM Bilateral 09/05/2013   Procedure: LEFT HEART CATHETERIZATION WITH CORONARY ANGIOGRAM;  Surgeon: Jettie Booze, MD;  Location: [facility] CATH LAB;  Service: Cardiovascular;  Laterality: Bilateral;   PERCUTANEOUS CORONARY STENT INTERVENTION (PCI-S)  09/05/2013   Procedure: PERCUTANEOUS CORONARY STENT INTERVENTION (PCI-S);  Surgeon: Jettie Booze, MD;  Location: [facility] CATH LAB;  Service: Cardiovascular;;  DES to mid LAD    Family Psychiatric History: Mother- Anxiety  Family History:  Family History  Problem Relation Age of Onset   Throat cancer Mother    Heart attack Neg Hx     Social History:  Social History   Socioeconomic History   Marital status: Single    Spouse name: Not on file   Number of children: Not on file   Years of education: Not on file   Highest education level: Not on file  Occupational History   Not on file  Tobacco Use   Smoking status: Every Day    Packs/day: 1.00  Types: Cigarettes    Last attempt to quit: 09/05/2013    Years since quitting: 9.2   Smokeless tobacco: Never  Substance and Sexual Activity   Alcohol use: Yes    Alcohol/week: 1.0 standard drink of alcohol    Types: 1 Shots of liquor per week   Drug use: No   Sexual activity: Not on file  Other Topics Concern   Not on file  Social History Narrative   Not on file   Social Determinants of Health   Financial Resource Strain: Not on file  Food Insecurity: Not on file  Transportation Needs: Not on file  Physical Activity: Not on file  Stress: Not on file  Social Connections: Not on file     Allergies: No Known Allergies  Metabolic Disorder Labs: No results found for: "HGBA1C", "MPG" No results found for: "PROLACTIN" Lab Results  Component Value Date   CHOL 131 08/08/2016   TRIG 68 08/08/2016   HDL 38 (L) 08/08/2016   CHOLHDL 3.4 08/08/2016   VLDL 14 08/08/2016   LDLCALC 79 08/08/2016   [place] 83 02/19/2014   No results found for: "TSH"  Therapeutic Level Labs: No results found for: "LITHIUM" No results found for: "VALPROATE" No results found for: "CBMZ"  Current Medications: Current Outpatient Medications  Medication Sig Dispense Refill   ARIPiprazole (ABILIFY) 10 MG tablet Take 1 tablet (10 mg total) by mouth daily. 30 tablet 3   aspirin 81 MG chewable tablet Chew 1 tablet (81 mg total) by mouth daily.     atorvastatin (LIPITOR) 40 MG tablet TAKE 1 TABLET BY MOUTH DAILY AT 6 PM. 30 tablet 2   clonazePAM (KLONOPIN) 0.5 MG tablet Take 1 tablet (0.5 mg total) by mouth daily. 30 tablet 2   clopidogrel (PLAVIX) 75 MG tablet TAKE 1 TABLET BY MOUTH DAILY. 90 tablet 1   furosemide (LASIX) 20 MG tablet TAKE 1 TABLET BY MOUTH 2 TIMES DAILY AS NEEDED FOR FLUID OR EDEMA (SHORTNESS OF BREATH). 60 tablet 2   lisinopril (PRINIVIL,ZESTRIL) 5 MG tablet TAKE 1 TABLET BY MOUTH DAILY. 30 tablet 2   metoprolol tartrate (LOPRESSOR) 25 MG tablet TAKE 1/2 TABLET BY MOUTH 2 TIMES DAILY. 30 tablet 2   nitroGLYCERIN (NITROSTAT) 0.4 MG SL tablet Place 1 tablet (0.4 mg total) under the tongue every 5 (five) minutes as needed for chest pain. 3 DOSES MAX 25 tablet 3   ondansetron (ZOFRAN ODT) 4 MG disintegrating tablet Take 1 tablet (4 mg total) by mouth every 8 (eight) hours as needed for nausea or vomiting. 20 tablet 0   potassium chloride SA (KLOR-CON M20) 20 MEQ tablet Take 2 tablets (40 mEq total) by mouth daily. (Patient taking differently: Take 40 mEq by mouth daily as needed (when taking Lasix). ) 90 tablet 1   sertraline (ZOLOFT) 100 MG tablet Take 1.5 tablets (150 mg total) by  mouth daily. 45 tablet 3   traZODone (DESYREL) 150 MG tablet Take 1 tablet (150 mg total) by mouth at bedtime. 30 tablet 3   No current facility-administered medications for this visit.     Musculoskeletal: Strength & Muscle Tone: within normal limits Gait & Station: normal Patient leans: N/A  Psychiatric Specialty Exam: Review of Systems  Respiratory:  Negative for shortness of breath.   Cardiovascular:  Negative for chest pain.  Gastrointestinal:  Negative for abdominal pain, constipation, diarrhea, nausea and vomiting.  Neurological:  Negative for dizziness, weakness and headaches.  Psychiatric/Behavioral:  Negative for dysphoric mood, hallucinations, sleep disturbance  and suicidal ideas. The patient is nervous/anxious.     There were no vitals taken for this visit.There is no height or weight on file to calculate BMI.  General Appearance: Casual and Fairly Groomed  Eye Contact:  Good  Speech:  Clear and Coherent and Normal Rate  Volume:  Normal  Mood:   "ok"  Affect:  Appropriate and Congruent  Thought Process:  Coherent and Goal Directed  Orientation:  Full (Time, Place, and Person)  Thought Content: WDL and Logical   Suicidal Thoughts:  No  Homicidal Thoughts:  No  Memory:  Immediate;   Good Recent;   Good  Judgement:  Good  Insight:  Good  Psychomotor Activity:  Normal  Concentration:  Concentration: Good and Attention Span: Good  Recall:  Good  Fund of Knowledge: Good  Language: Good  Akathisia:  Negative  Handed:  Right  AIMS (if indicated): not done  Assets:  Communication Skills Desire for Improvement Financial Resources/Insurance Housing Physical Health Resilience Social Support  ADL's:  Intact  Cognition: WNL  Sleep:  Good   Screenings: GAD-7    Flowsheet Row Video Visit from 09/14/2022 in [facility] Video Visit from 06/23/2022 in [facility] Video Visit from 10/21/2021 in [facility] Video Visit from 07/23/2021 in [facility]  Total GAD-7 Score 0 18 6 16       PHQ2-9    Flowsheet Row Video Visit from 09/14/2022 in [facility] Video Visit from 06/23/2022 in [facility] Video Visit from 10/21/2021 in [facility] Video Visit from 07/23/2021 in [facility]  PHQ-2 Total Score 0 4 3 1   PHQ-9 Total Score 0 21 7 5       Flowsheet Row Video Visit from 06/23/2022 in [facility] ED from 11/08/2021 in [facility] Emergency Department at [facility]  C-SSRS RISK CATEGORY Low Risk No Risk        Assessment and Plan:  Michele Black is a 62 yr old female who presents via Virtual Video Visit for Follow Up and Medication Management.  PPHx is significant for MDD and Anxiety.   Cledith has continued to do okay on her medication regimen.  She reports she still does have significant anxiety around driving a car.  To better help her depression and anxiety we will increase her Zoloft at this time.  She tolerated the reduction in her Klonopin to once a day well without significant issue and still has a refill left.  We will not send in a refill of her Klonopin at this time but she will call back for refills when needed.  At her next appointment we will further reduce her Klonopin.  She will return follow-up in approximately 3 months.   MDD, Recurrent  GAD: -Continue Abilify 10 mg daily for augmentation.  30 tablets with 3 refills. -Increase Zoloft 150 mg daily for depression and anxiety.  45 (100 mg) tablets with 3 refills. -Continue Trazodone 150 mg QHS for sleep.  30 tablets with 3 refills. -Continue Klonopin 0.5 mg daily.  No refills sent at this time.   Collaboration of Care:   Patient/Guardian was advised Release of Information must be obtained prior to any record release in order to  collaborate their care with an outside provider. Patient/Guardian was advised if they have not already done so to contact the registration department to  sign all necessary forms in order for [place] to release information regarding their care.   Consent: Patient/Guardian gives verbal consent for treatment and assignment of benefits for services provided during this visit. Patient/Guardian expressed understanding and agreed to proceed.    Michele Cedar, MD 12/16/2022, 8:57 AM    Follow Up Instructions:    I discussed the assessment and treatment plan with the patient. The patient was provided an opportunity to ask questions and all were answered. The patient agreed with the plan and demonstrated an understanding of the instructions.   The patient was advised to call back or seek an in-person evaluation if the symptoms worsen or if the condition fails to improve as anticipated.  I provided 15 minutes of non-face-to-face time during this encounter.   Michele Cedar, MD

## 2022-12-23 IMAGING — MR MR HEAD W/O CM
12 series · 48 of 48 positions shown · non-contrast
Comparison: Head CT 11/08/2021.

CLINICAL DATA: 60-year-old female with dizziness. Neurologic
deficit. Suspected left para falcine meningioma on plain head CT.

EXAM:
MRI HEAD WITHOUT CONTRAST
TECHNIQUE: Multiplanar, multiecho pulse sequences of the brain and surrounding
structures were obtained without intravenous contrast.

[Series 5: DWI · axial · 3.0mm · 1.36mm/px · z∈[-59,+99]mm · 10 of 108 slices shown (1 of 4)]
[im 1/108]
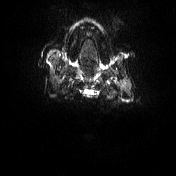
[im 12/108]
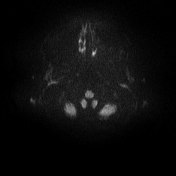
[im 24/108]
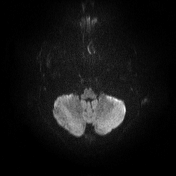
[im 36/108]
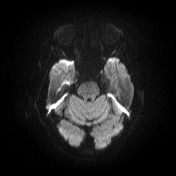
[im 48/108]
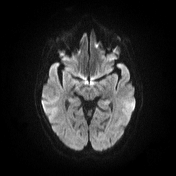
[im 60/108]
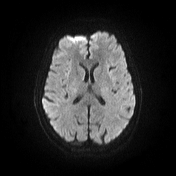
[im 72/108]
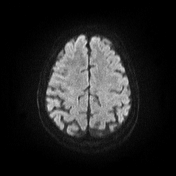
[im 84/108]
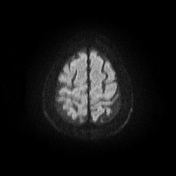
[im 96/108]
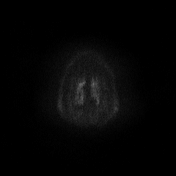
[im 108/108]
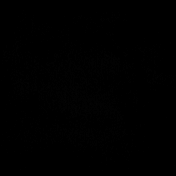

[Series 6: DWI · axial · 3.0mm · 1.36mm/px · z∈[-59,+93]mm · 4 of 51 slices shown (2 of 4)]
[im 1/51]
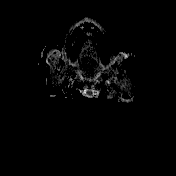
[im 17/51]
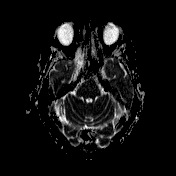
[im 34/51]
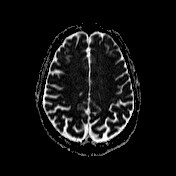
[im 51/51]
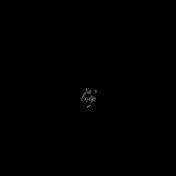

[Series 7: T1 · sagittal · 5.0mm · 0.75mm/px · 2 of 23 slices shown (1 of 2)]
[im 1/23]
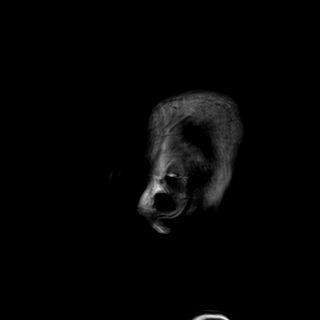
[im 23/23]
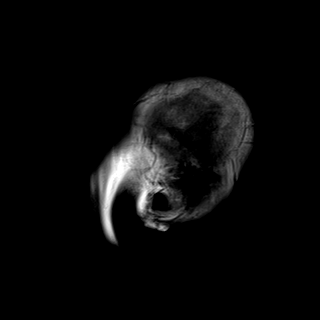

[Series 8: T2 · axial · 5.0mm · 0.62mm/px · z∈[-56,+92]mm · 2 of 24 slices shown (1 of 3)]
[im 1/24]
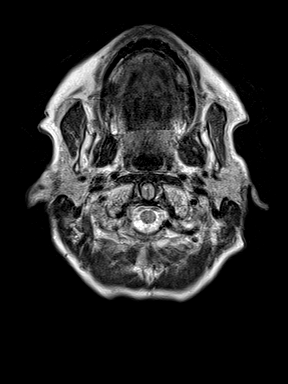
[im 24/24]
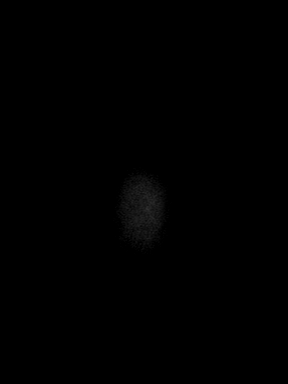

[Series 9: FLAIR · axial · 3.0mm · 0.75mm/px · z∈[-58,+93]mm · 5 of 52 slices shown (1 of 2)]
[im 1/52]
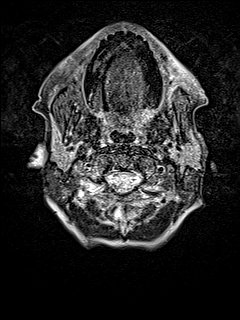
[im 13/52]
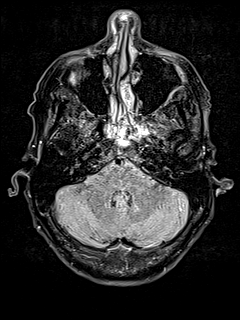
[im 26/52]
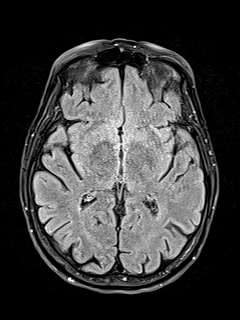
[im 39/52]
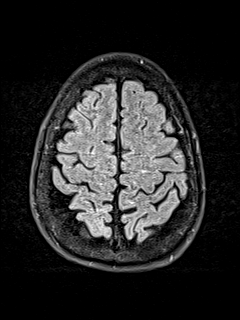
[im 52/52]
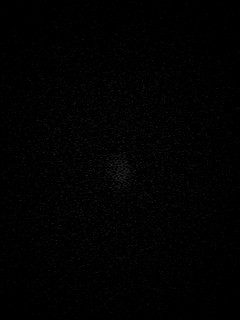

[Series 10: GRE · axial · 3.0mm · 0.45mm/px · z∈[-63,+91]mm · 5 of 53 slices shown]
[im 1/53]
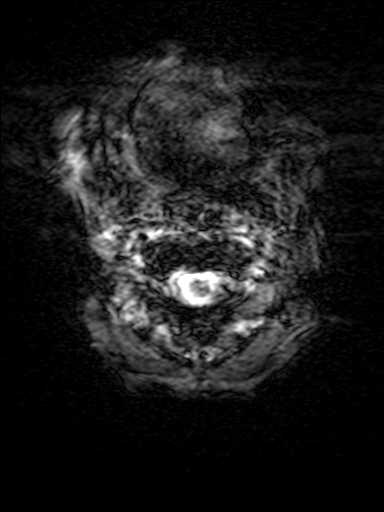
[im 14/53]
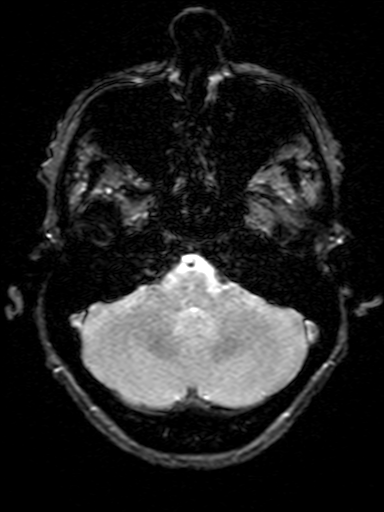
[im 27/53]
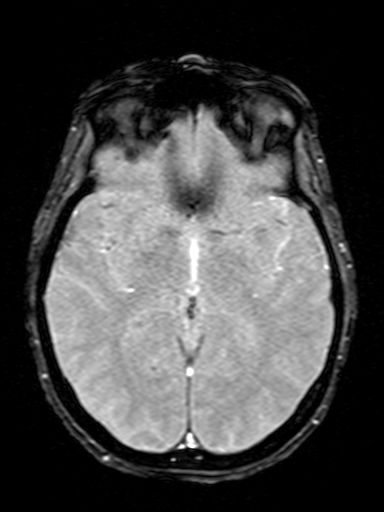
[im 40/53]
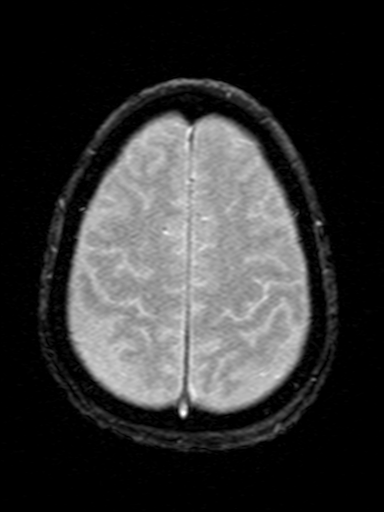
[im 53/53]
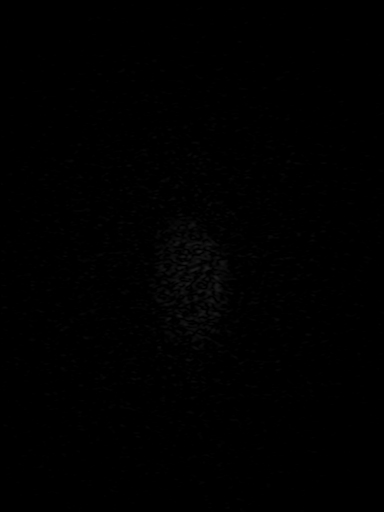

[Series 11: T1 · axial · 3.0mm · 0.45mm/px · z∈[-56,+92]mm · 4 of 51 slices shown (2 of 2)]
[im 1/51]
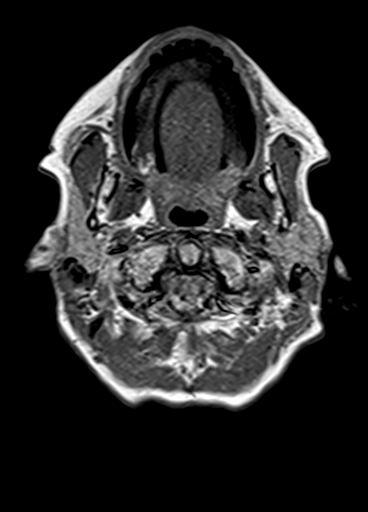
[im 17/51]
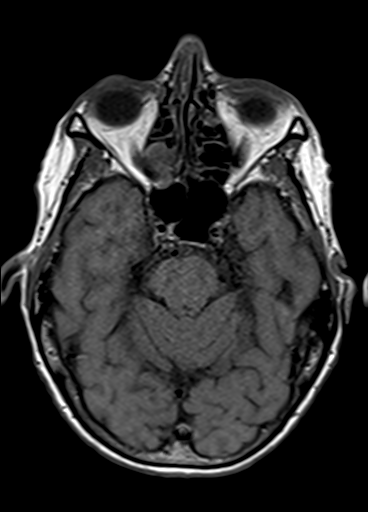
[im 34/51]
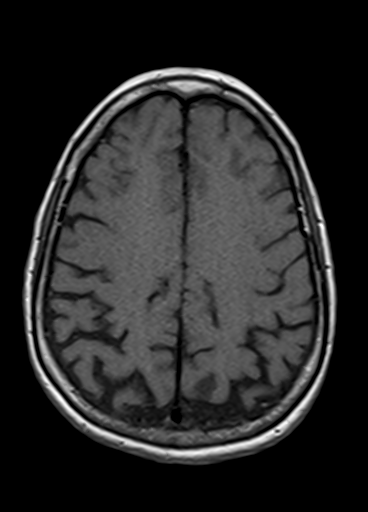
[im 51/51]
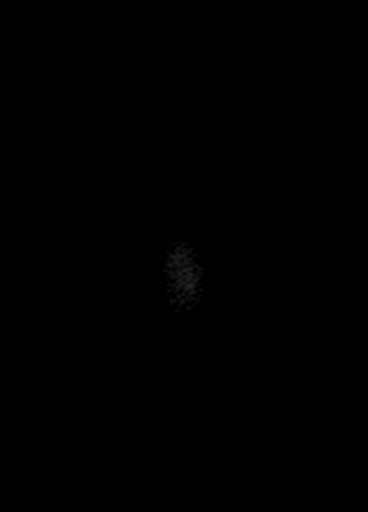

[Series 12: T2 · sagittal · 5.0mm · 0.47mm/px · 2 of 24 slices shown (2 of 3)]
[im 1/24]
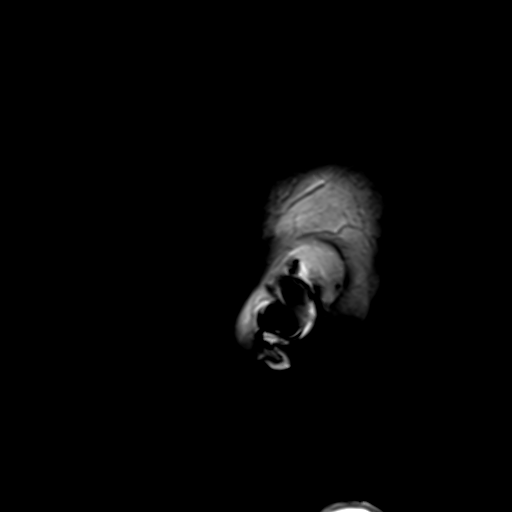
[im 24/24]
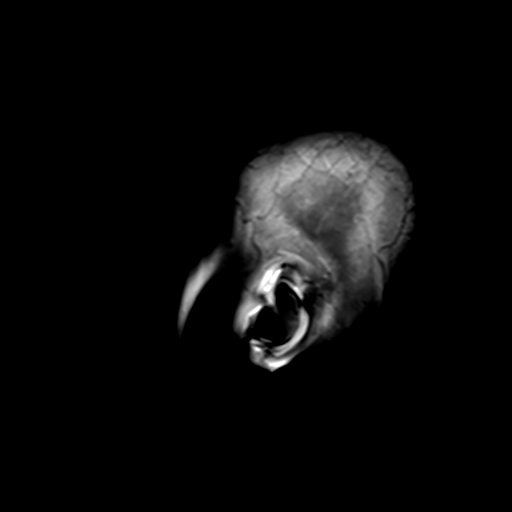

[Series 13: T2 · coronal · 5.0mm · 0.86mm/px · 2 of 25 slices shown (3 of 3)]
[im 1/25]
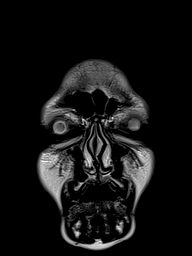
[im 25/25]
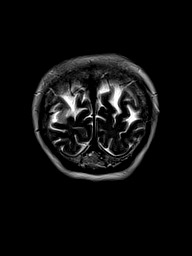

[Series 14: DWI · coronal · 5.0mm · 1.31mm/px · 5 of 56 slices shown (3 of 4)]
[im 1/56]
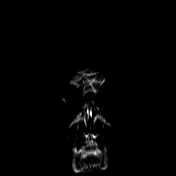
[im 14/56]
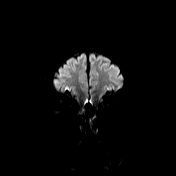
[im 28/56]
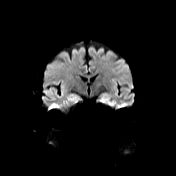
[im 42/56]
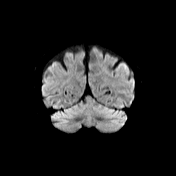
[im 56/56]
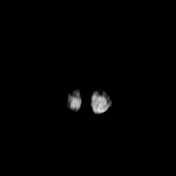

[Series 15: DWI · coronal · 5.0mm · 1.31mm/px · 2 of 28 slices shown (4 of 4)]
[im 1/28]
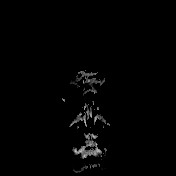
[im 28/28]
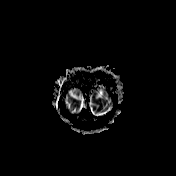

[Series 16: FLAIR · axial · 3.0mm · 0.86mm/px · z∈[-60,+94]mm · 5 of 53 slices shown (2 of 2)]
[im 1/53]
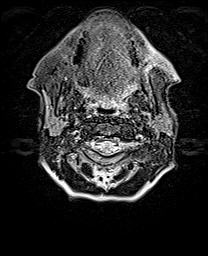
[im 14/53]
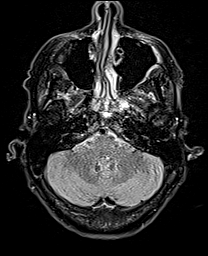
[im 27/53]
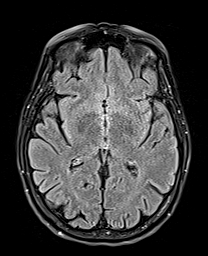
[im 40/53]
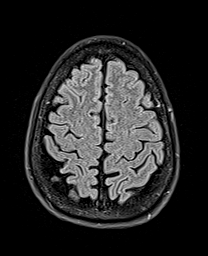
[im 53/53]
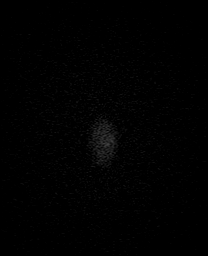

[48 of 48 positions shown; findings below may reference images not displayed]

FINDINGS: Brain: No restricted diffusion to suggest acute infarction. No
midline shift, ventriculomegaly, extra-axial collection or acute
intracranial hemorrhage. Cervicomedullary junction and pituitary are
within normal limits.

Small left para falcine 4-5 mm lesion (series 13, image 15) is
indeterminate for dural calcification versus tiny meningioma. There
is no associated mass effect.

Gray and white matter signal is within normal limits for age
throughout the brain. No cortical encephalomalacia or chronic
cerebral blood products identified.

Vascular: Major intracranial vascular flow voids are preserved. The
left vertebral artery appears dominant.

Skull and upper cervical spine: Negative visible cervical spine.
Normal bone marrow signal.

Sinuses/Orbits: Negative orbits. Opacified posterior right ethmoid
air cell again noted but generally well aerated paranasal sinuses
and mastoids.

Other: Grossly normal visible internal auditory structures. Negative
visible scalp and face.
IMPRESSION: No acute intracranial abnormality.

Essentially normal for age noncontrast MRI appearance of the brain;
a 4-5 mm left para falcine lesion is indeterminate for a dural
calcification versus tiny meningioma.

## 2023-02-20 ENCOUNTER — Other Ambulatory Visit (HOSPITAL_COMMUNITY): Payer: Self-pay | Admitting: Psychiatry

## 2023-02-20 DIAGNOSIS — F418 Other specified anxiety disorders: Secondary | ICD-10-CM

## 2023-03-30 ENCOUNTER — Other Ambulatory Visit (HOSPITAL_COMMUNITY): Payer: Self-pay | Admitting: Psychiatry

## 2023-03-30 DIAGNOSIS — F418 Other specified anxiety disorders: Secondary | ICD-10-CM

## 2023-06-05 ENCOUNTER — Telehealth (HOSPITAL_COMMUNITY): Payer: Self-pay

## 2023-06-07 ENCOUNTER — Telehealth (HOSPITAL_COMMUNITY): Payer: Self-pay | Admitting: *Deleted

## 2023-06-07 NOTE — Telephone Encounter (Signed)
Patient called asking for refills of all her psychiatric medications. Chart reviewed, no appointments scheduled. Explained that she had not been seen since January and would need to make an appointment.

## 2023-06-13 ENCOUNTER — Telehealth (HOSPITAL_COMMUNITY): Payer: Self-pay | Admitting: *Deleted

## 2023-06-13 DIAGNOSIS — F418 Other specified anxiety disorders: Secondary | ICD-10-CM

## 2023-06-13 MED ORDER — SERTRALINE HCL 100 MG PO TABS: 150.0000 mg | ORAL_TABLET | Freq: Every day | ORAL | 0 refills | Status: DC

## 2023-06-13 MED ORDER — ARIPIPRAZOLE 10 MG PO TABS: 10.0000 mg | ORAL_TABLET | Freq: Every day | ORAL | 0 refills | Status: DC

## 2023-06-13 MED ORDER — TRAZODONE HCL 150 MG PO TABS: 150.0000 mg | ORAL_TABLET | Freq: Every day | ORAL | 0 refills | Status: DC

## 2023-06-13 NOTE — Telephone Encounter (Signed)
Received message that patient needed refills of her medications.  She has an appointment scheduled for 8/9.  One month refills will be sent in.   Sent: -Abilify 10 mg daily.  30 tablets with 0 refills. -Zoloft 150 mg daily.  45 (100 mg) tablets with 0 refills. -Trazodone 150 mg QHS.  30 tablets with 0 refills.   Arna Snipe MD Resident

## 2023-06-13 NOTE — Telephone Encounter (Signed)
Patient called asking for refills of her psychiatric medications. She has an appointment 06/30/23. States she really needs them.

## 2023-06-30 ENCOUNTER — Encounter (HOSPITAL_COMMUNITY): Payer: Self-pay | Admitting: Student in an Organized Health Care Education/Training Program

## 2023-06-30 ENCOUNTER — Ambulatory Visit (INDEPENDENT_AMBULATORY_CARE_PROVIDER_SITE_OTHER): Payer: Self-pay | Admitting: Student in an Organized Health Care Education/Training Program

## 2023-06-30 VITALS — BP 148/78 | HR 73 | Ht 65.0 in | Wt 141.0 lb

## 2023-06-30 DIAGNOSIS — F418 Other specified anxiety disorders: Secondary | ICD-10-CM

## 2023-06-30 MED ORDER — ARIPIPRAZOLE 10 MG PO TABS
10.0000 mg | ORAL_TABLET | Freq: Every day | ORAL | 1 refills | Status: DC
Start: 2023-06-30 — End: 2023-08-16

## 2023-06-30 MED ORDER — TRAZODONE HCL 150 MG PO TABS
150.0000 mg | ORAL_TABLET | Freq: Every day | ORAL | 1 refills | Status: DC
Start: 2023-06-30 — End: 2023-08-16

## 2023-06-30 MED ORDER — PROPRANOLOL HCL 10 MG PO TABS: 10.0000 mg | ORAL_TABLET | Freq: Two times a day (BID) | ORAL | 1 refills | Status: DC

## 2023-06-30 MED ORDER — SERTRALINE HCL 100 MG PO TABS
150.0000 mg | ORAL_TABLET | Freq: Every day | ORAL | 1 refills | Status: DC
Start: 2023-06-30 — End: 2023-08-16

## 2023-06-30 NOTE — Progress Notes (Addendum)
BH MD/PA/NP OP Progress Note  06/30/2023 9:25 AM Michele Black  MRN:  [id number]  Chief Complaint:  Chief Complaint  Patient presents with   Follow-up   Anxiety   Depression   HPI:  Michele Black is a 62 yr old female who presents for Follow Up and Medication Management.  PPHx is significant for MDD and Anxiety.   She reports that she has not noticed a significant change in symptoms with the increase in the Zoloft.  She reports no side effects to it either.  She reports she is has significant anxiety whenever getting in a car but does report that her day today anxiety otherwise is manageable.  Discussed starting propranolol.  Discussed potential risks and side effects and she was agreeable to the trial.  Discussed we would not make any other changes to her medications at this time and she was agreeable.  She reports no SI, HI, or AVH.  She reports her sleep is good.  She reports her appetite is doing good.  She reports no other concerns present.  She will return for follow-up approximately 6 weeks.  Discussed with patient that Resident Provider would be transitioning their care to another Resident Provider Dr. Jerrel Ivory.  She reported understanding and had no concerns.    Visit Diagnosis:    ICD-10-CM   1. Depression with anxiety  F41.8 ARIPiprazole (ABILIFY) 10 MG tablet    sertraline (ZOLOFT) 100 MG tablet    traZODone (DESYREL) 150 MG tablet    propranolol (INDERAL) 10 MG tablet       Past Psychiatric History: MDD and Anxiety.  Past Medical History:  Past Medical History:  Diagnosis Date   Acute myocardial infarction of other anterior wall, initial episode of care    Anxiety    Arthritis    Ischemic cardiomyopathy    Mixed hyperlipidemia    Tobacco use disorder     Past Surgical History:  Procedure Laterality Date   LEFT HEART CATHETERIZATION WITH CORONARY ANGIOGRAM Bilateral 09/05/2013   Procedure: LEFT HEART CATHETERIZATION WITH CORONARY ANGIOGRAM;  Surgeon: Corky Crafts, MD;  Location: [facility] CATH LAB;  Service: Cardiovascular;  Laterality: Bilateral;   PERCUTANEOUS CORONARY STENT INTERVENTION (PCI-S)  09/05/2013   Procedure: PERCUTANEOUS CORONARY STENT INTERVENTION (PCI-S);  Surgeon: Corky Crafts, MD;  Location: [facility] CATH LAB;  Service: Cardiovascular;;  DES to mid LAD    Family Psychiatric History: Mother- Anxiety  Family History:  Family History  Problem Relation Age of Onset   Throat cancer Mother    Heart attack Neg Hx     Social History:  Social History   Socioeconomic History   Marital status: Single    Spouse name: Not on file   Number of children: Not on file   Years of education: Not on file   Highest education level: Not on file  Occupational History   Not on file  Tobacco Use   Smoking status: Every Day    Current packs/day: 0.00    Types: Cigarettes    Last attempt to quit: 09/05/2013    Years since quitting: 9.8   Smokeless tobacco: Never  Substance and Sexual Activity   Alcohol use: Yes    Alcohol/week: 1.0 standard drink of alcohol    Types: 1 Shots of liquor per week   Drug use: No   Sexual activity: Not on file  Other Topics Concern   Not on file  Social History Narrative   Not on file   Social Determinants  of Health   Financial Resource Strain: Not on file  Food Insecurity: Not on file  Transportation Needs: Not on file  Physical Activity: Not on file  Stress: Not on file  Social Connections: Not on file    Allergies: No Known Allergies  Metabolic Disorder Labs: No results found for: "HGBA1C", "MPG" No results found for: "PROLACTIN" Lab Results  Component Value Date   CHOL 131 08/08/2016   TRIG 68 08/08/2016   HDL 38 (L) 08/08/2016   CHOLHDL 3.4 08/08/2016   VLDL 14 08/08/2016   LDLCALC 79 08/08/2016   LDLCALC 83 02/19/2014   No results found for: "TSH"  Therapeutic Level Labs: No results found for: "LITHIUM" No results found for: "VALPROATE" No results found for: "CBMZ"  Current  Medications: Current Outpatient Medications  Medication Sig Dispense Refill   propranolol (INDERAL) 10 MG tablet Take 1 tablet (10 mg total) by mouth 2 (two) times daily. 60 tablet 1   ARIPiprazole (ABILIFY) 10 MG tablet Take 1 tablet (10 mg total) by mouth daily. 30 tablet 1   aspirin 81 MG chewable tablet Chew 1 tablet (81 mg total) by mouth daily.     clonazePAM (KLONOPIN) 0.5 MG tablet Take 1 tablet (0.5 mg total) by mouth daily. 30 tablet 2   nitroGLYCERIN (NITROSTAT) 0.4 MG SL tablet Place 1 tablet (0.4 mg total) under the tongue every 5 (five) minutes as needed for chest pain. 3 DOSES MAX 25 tablet 3   ondansetron (ZOFRAN ODT) 4 MG disintegrating tablet Take 1 tablet (4 mg total) by mouth every 8 (eight) hours as needed for nausea or vomiting. 20 tablet 0   sertraline (ZOLOFT) 100 MG tablet Take 1.5 tablets (150 mg total) by mouth daily. 45 tablet 1   traZODone (DESYREL) 150 MG tablet Take 1 tablet (150 mg total) by mouth at bedtime. 30 tablet 1   No current facility-administered medications for this visit.     Musculoskeletal: Strength & Muscle Tone: within normal limits Gait & Station: normal Patient leans: N/A  Psychiatric Specialty Exam: Review of Systems  Respiratory:  Negative for shortness of breath.   Cardiovascular:  Negative for chest pain.  Gastrointestinal:  Negative for abdominal pain, constipation, diarrhea, nausea and vomiting.  Neurological:  Negative for dizziness, weakness and headaches.  Psychiatric/Behavioral:  Negative for dysphoric mood, hallucinations, sleep disturbance and suicidal ideas. The patient is nervous/anxious.     Blood pressure (!) 148/78, pulse 73, height 5\' 5"  (1.651 m), weight 141 lb (64 kg), SpO2 100%.Body mass index is 23.46 kg/m.  General Appearance: Casual and Fairly Groomed  Eye Contact:  Good  Speech:  Clear and Coherent and Normal Rate  Volume:  Normal  Mood:  Anxious mild  Affect:  Congruent  Thought Process:  Coherent and Goal  Directed  Orientation:  Full (Time, Place, and Person)  Thought Content: WDL and Logical   Suicidal Thoughts:  No  Homicidal Thoughts:  No  Memory:  Immediate;   Good Recent;   Good  Judgement:  Good  Insight:  Good  Psychomotor Activity:  Normal  Concentration:  Concentration: Good and Attention Span: Good  Recall:  Good  Fund of Knowledge: Good  Language: Good  Akathisia:  Negative  Handed:  Right  AIMS (if indicated): not done  Assets:  Communication Skills Desire for Improvement Financial Resources/Insurance Housing Physical Health Resilience Social Support  ADL's:  Intact  Cognition: WNL  Sleep:  Good   Screenings: GAD-7    Haematologist  Visit from 09/14/2022 in [facility] Video Visit from 06/23/2022 in [facility] Video Visit from 10/21/2021 in [facility] Video Visit from 07/23/2021 in [facility]  Total GAD-7 Score 0 18 6 16       PHQ2-9    Flowsheet Row Video Visit from 09/14/2022 in [facility] Video Visit from 06/23/2022 in [facility] Video Visit from 10/21/2021 in [facility] Video Visit from 07/23/2021 in [facility]  PHQ-2 Total Score 0 4 3 1   PHQ-9 Total Score 0 21 7 5       Flowsheet Row Video Visit from 06/23/2022 in [facility] ED from 11/08/2021 in [facility] Emergency Department at [facility]  C-SSRS RISK CATEGORY Low Risk No Risk        Assessment and Plan:  Lillyanah Ginger is a 62 yr old female who presents for Follow Up and Medication Management.  PPHx is significant for MDD and Anxiety.   Temia tolerated the increase in Zoloft without significant improvement.  Given her blood pressure is a little elevated we will trial propranolol at this time as per chart review hydroxyzine and  gabapentin have been ineffective at controlling her anxiety in the past. Beginning to change her medications at this time.  Refills were sent in.  She will return to follow-up approximately 6 weeks.   MDD, Recurrent  GAD: -Continue Abilify 10 mg daily for augmentation.  30 tablets with 1 refill. -Continue Zoloft 150 mg daily for depression and anxiety.  45 (100 mg) tablets with 1 refill. -Continue Trazodone 150 mg QHS for sleep.  30 tablets with 1 refill. -Start Propanolol 10 mg BID for anxiety.  60 tablets with 1 refill. -Continue Klonopin 0.5 mg daily.  No refills sent at this time.   Collaboration of Care:   Patient/Guardian was advised Release of Information must be obtained prior to any record release in order to collaborate their care with an outside provider. Patient/Guardian was advised if they have not already done so to contact the registration department to sign all necessary forms in order for [place] to release information regarding their care.   Consent: Patient/Guardian gives verbal consent for treatment and assignment of benefits for services provided during this visit. Patient/Guardian expressed understanding and agreed to proceed.    Lauro Franklin, MD 06/30/2023, 9:25 AM

## 2023-08-16 ENCOUNTER — Ambulatory Visit (INDEPENDENT_AMBULATORY_CARE_PROVIDER_SITE_OTHER): Payer: No Payment, Other | Admitting: Student

## 2023-08-16 VITALS — BP 150/80 | Ht 65.0 in | Wt 142.0 lb

## 2023-08-16 DIAGNOSIS — F411 Generalized anxiety disorder: Secondary | ICD-10-CM | POA: Diagnosis not present

## 2023-08-16 DIAGNOSIS — F3341 Major depressive disorder, recurrent, in partial remission: Secondary | ICD-10-CM | POA: Diagnosis not present

## 2023-08-16 DIAGNOSIS — Z79899 Other long term (current) drug therapy: Secondary | ICD-10-CM | POA: Diagnosis not present

## 2023-08-16 DIAGNOSIS — D61818 Other pancytopenia: Secondary | ICD-10-CM | POA: Diagnosis not present

## 2023-08-16 DIAGNOSIS — F418 Other specified anxiety disorders: Secondary | ICD-10-CM

## 2023-08-16 MED ORDER — SERTRALINE HCL 100 MG PO TABS
150.0000 mg | ORAL_TABLET | Freq: Every day | ORAL | 2 refills | Status: DC
Start: 2023-08-16 — End: 2024-01-08

## 2023-08-16 MED ORDER — ARIPIPRAZOLE 10 MG PO TABS
10.0000 mg | ORAL_TABLET | Freq: Every day | ORAL | 2 refills | Status: DC
Start: 2023-08-16 — End: 2024-01-08

## 2023-08-16 MED ORDER — TRAZODONE HCL 150 MG PO TABS
150.0000 mg | ORAL_TABLET | Freq: Every day | ORAL | 2 refills | Status: DC
Start: 2023-08-16 — End: 2024-01-08

## 2023-08-16 MED ORDER — PROPRANOLOL HCL 10 MG PO TABS
10.0000 mg | ORAL_TABLET | Freq: Two times a day (BID) | ORAL | 2 refills | Status: DC
Start: 2023-08-16 — End: 2024-01-08

## 2023-08-16 NOTE — Patient Instructions (Signed)
Please follow-up with the 2 practices listed below to obtain primary care services:  [facility] [address] [place] [phone]  [facility] [address], [place] [phone]

## 2023-08-16 NOTE — Progress Notes (Addendum)
BH MD Outpatient Progress Note  08/18/2023 1:03 PM Michele Black  MRN:  [id number]  Assessment:  [name] presents for follow-up evaluation.  This patient is a former patient of Dr Renaldo Fiddler, last seen on June 30, 2023.  At that time the primary diagnoses were MDD and GAD, both of which I agree with based on my assessment of the patient today.  At the last visit it appears the patient was doing fairly well but propranolol 10 mg twice daily was started for residual anxiety.  The patient reports benefit from taking this medication.  She says that she is doing well in her job and enjoys her home life.  She does not desire a medication change presently, and this was agreed upon.  It was noted, unfortunately, that the patient has pancytopenia, seen on lab work from 2022.  White blood cells 3, hemoglobin 11, platelets 124.  This is concerning, and I expressed this to the patient.  I told her that it was important for her to follow-up with a primary care physician.  The patient does not presently have a primary care physician, so I provided contact information for 2 practices in [place].  She expressed her intention to follow-up with them.  She will see [place] for a lab appointment on November 11, before my next follow-up with her.  Some concern that the pancytopenia may be d/t her Abilify.  It is at a high dose for augmentation anyway.  Will discuss decreasing the dose of the medication at the next visit.  Identifying Information: Michele Black is a 62 y.o. y.o. female with a history of major depressive disorder and generalized anxiety disorder who is an established patient with [facility] for management of anxiety.   Plan:  # MDD  GAD Interventions: -- Continue Zoloft 150 mg daily - Continue Abilify 10 mg daily - Continue trazodone 150 mg nightly - Continue propranolol 10 mg twice daily  # Pancytopenia of unknown etiology -- Patient given information for 2 primary care  practices - Repeat labs with [place] on November 11  #Long term use of antipsychotic medication -- Lipid panel last obtained many years ago, will get labs on November 11 - A1c from last obtained many years ago, will get labs on November 11 - EKG from 1 year ago, normal sinus rhythm with QTc 437 - AIMS 0.  Patient noted to be rocking back and forth and moving her tongue in a distracting way at times.  It appears this is more volitional in nature based on talking with her.  My AIMS testing was 0.  Patient was given contact information for behavioral health clinic and was instructed to call 911 for emergencies.   Subjective:  Chief Complaint:  Chief Complaint  Patient presents with   Follow-up    Interval History:  Pertinent social history collected.  The patient reports that she was married many years ago but now is divorced.  She has a daughter who is 74 years old with grown children.  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  She reports great enjoyment in this job.  She denies the use of any alcohol or illegal drugs.  She does state that she smokes on a regular basis.  Throughout the interview the patient is pleasant and has an appropriate affect.  She feels that she is doing well and her medications are effective and does not need a medication change.  She  reports that she experiences some anxiety at work but that this occurs only occasionally.  She reports drinking 4 Pepsi drinks per day.  We discussed that the caffeine from these drinks will make her more anxious.  She seems surprised but expressed her desire to cut down on the amount she drinks.  She reports sleeping approximately 8 hours per night and eating 3 good sized meals per day.  She denies experiencing any hopelessness or suicidal thoughts.  She states that she is fully adherent to her medication regimen.  Visit Diagnosis:    ICD-10-CM   1. Generalized anxiety disorder  F41.1      2. Recurrent major depressive disorder, in partial remission (HCC)  F33.41     3. Depression with anxiety  F41.8 traZODone (DESYREL) 150 MG tablet    sertraline (ZOLOFT) 100 MG tablet    propranolol (INDERAL) 10 MG tablet    ARIPiprazole (ABILIFY) 10 MG tablet    4. Other pancytopenia (HCC)  D61.818 CBC with Differential    5. Long term current use of antipsychotic medication  Z79.899       Past Psychiatric History:  No history of psychiatric admission or suicide attempts  Past Medical History:  Past Medical History:  Diagnosis Date   Acute myocardial infarction of other anterior wall, initial episode of care    Anxiety    Arthritis    Ischemic cardiomyopathy    Mixed hyperlipidemia    Tobacco use disorder     Past Surgical History:  Procedure Laterality Date   LEFT HEART CATHETERIZATION WITH CORONARY ANGIOGRAM Bilateral 09/05/2013   Procedure: LEFT HEART CATHETERIZATION WITH CORONARY ANGIOGRAM;  Surgeon: Corky Crafts, MD;  Location: [facility] CATH LAB;  Service: Cardiovascular;  Laterality: Bilateral;   PERCUTANEOUS CORONARY STENT INTERVENTION (PCI-S)  09/05/2013   Procedure: PERCUTANEOUS CORONARY STENT INTERVENTION (PCI-S);  Surgeon: Corky Crafts, MD;  Location: [facility] CATH LAB;  Service: Cardiovascular;;  DES to mid LAD    Family Psychiatric History: None pertinent  Family History:  Family History  Problem Relation Age of Onset   Throat cancer Mother    Heart attack Neg Hx     Social History:  Social History   Socioeconomic History   Marital status: Single    Spouse name: Not on file   Number of children: Not on file   Years of education: Not on file   Highest education level: Not on file  Occupational History   Not on file  Tobacco Use   Smoking status: Every Day    Current packs/day: 0.00    Types: Cigarettes    Last attempt to quit: 09/05/2013    Years since quitting: 9.9   Smokeless tobacco: Never  Substance and Sexual Activity   Alcohol  use: Yes    Alcohol/week: 1.0 standard drink of alcohol    Types: 1 Shots of liquor per week   Drug use: No   Sexual activity: Not on file  Other Topics Concern   Not on file  Social History Narrative   Not on file   Social Determinants of Health   Financial Resource Strain: Not on file  Food Insecurity: Not on file  Transportation Needs: Not on file  Physical Activity: Not on file  Stress: Not on file  Social Connections: Not on file    Allergies: No Known Allergies  Current Medications: Current Outpatient Medications  Medication Sig Dispense Refill   ARIPiprazole (ABILIFY) 10 MG tablet Take 1 tablet (10 mg total) by  mouth daily. 30 tablet 2   aspirin 81 MG chewable tablet Chew 1 tablet (81 mg total) by mouth daily.     nitroGLYCERIN (NITROSTAT) 0.4 MG SL tablet Place 1 tablet (0.4 mg total) under the tongue every 5 (five) minutes as needed for chest pain. 3 DOSES MAX 25 tablet 3   ondansetron (ZOFRAN ODT) 4 MG disintegrating tablet Take 1 tablet (4 mg total) by mouth every 8 (eight) hours as needed for nausea or vomiting. 20 tablet 0   propranolol (INDERAL) 10 MG tablet Take 1 tablet (10 mg total) by mouth 2 (two) times daily. 60 tablet 2   sertraline (ZOLOFT) 100 MG tablet Take 1.5 tablets (150 mg total) by mouth daily. 45 tablet 2   traZODone (DESYREL) 150 MG tablet Take 1 tablet (150 mg total) by mouth at bedtime. 30 tablet 2   No current facility-administered medications for this visit.     Objective:  Psychiatric Specialty Exam: Physical Exam Constitutional:      Appearance: the patient is not toxic-appearing.  Pulmonary:     Effort: Pulmonary effort is normal.  Neurological:     General: No focal deficit present.     Mental Status: the patient is alert and oriented to person, place, and time.   Review of Systems  Respiratory:  Negative for shortness of breath.   Cardiovascular:  Negative for chest pain.  Gastrointestinal:  Negative for abdominal pain,  constipation, diarrhea, nausea and vomiting.  Neurological:  Negative for headaches.      BP (!) 150/80   Ht 5\' 5"  (1.651 m)   Wt 142 lb (64.4 kg)   BMI 23.63 kg/m   General Appearance: Fairly Groomed  Eye Contact:  Good  Speech:  Clear and Coherent  Volume:  Normal  Mood:  Euthymic  Affect:  Congruent  Thought Process:  Coherent  Orientation:  Full (Time, Place, and Person)  Thought Content: Logical   Suicidal Thoughts:  No  Homicidal Thoughts:  No  Memory:  Immediate;   Good  Judgement:  fair  Insight:  fair  Psychomotor Activity:  Normal  Concentration:  Concentration: Good  Recall:  Good  Fund of Knowledge: Good  Language: Good  Akathisia:  No  Handed:    AIMS (if indicated): not done  Assets:  Communication Skills Desire for Improvement Financial Resources/Insurance Housing Leisure Time Physical Health  ADL's:  Intact  Cognition: WNL  Sleep:  Fair     Metabolic Disorder Labs: No results found for: "HGBA1C", "MPG" No results found for: "PROLACTIN" Lab Results  Component Value Date   CHOL 131 08/08/2016   TRIG 68 08/08/2016   HDL 38 (L) 08/08/2016   CHOLHDL 3.4 08/08/2016   VLDL 14 08/08/2016   LDLCALC 79 08/08/2016   LDLCALC 83 02/19/2014   No results found for: "TSH"  Therapeutic Level Labs: No results found for: "LITHIUM" No results found for: "VALPROATE" No results found for: "CBMZ"  Screenings: GAD-7    Flowsheet Row Video Visit from 09/14/2022 in [facility] Video Visit from 06/23/2022 in [facility] Video Visit from 10/21/2021 in [facility] Video Visit from 07/23/2021 in [facility]  Total GAD-7 Score 0 18 6 16       PHQ2-9    Flowsheet Row Video Visit from 09/14/2022 in [facility] Video Visit from 06/23/2022 in [facility] Video Visit from 10/21/2021 in [facility] Video Visit from 07/23/2021 in [facility]  PHQ-2 Total Score 0 4 3 1   PHQ-9 Total Score 0 21 7 5       Flowsheet Row Video Visit from 06/23/2022 in [facility] ED from 11/08/2021 in [facility] Emergency Department at [facility]  C-SSRS RISK CATEGORY Low Risk No Risk       Collaboration of Care: none  A total of 30 minutes was spent involved in face to face clinical care, chart review, documentation.   Carlyn Reichert, MD 08/18/2023, 1:03 PM

## 2023-08-18 DIAGNOSIS — D61818 Other pancytopenia: Secondary | ICD-10-CM | POA: Insufficient documentation

## 2023-10-02 ENCOUNTER — Other Ambulatory Visit (HOSPITAL_COMMUNITY): Payer: No Payment, Other

## 2023-10-17 ENCOUNTER — Encounter (HOSPITAL_COMMUNITY): Payer: No Payment, Other | Admitting: Student

## 2023-11-20 NOTE — Telephone Encounter (Signed)
done

## 2024-01-04 ENCOUNTER — Telehealth (HOSPITAL_COMMUNITY): Payer: Self-pay | Admitting: *Deleted

## 2024-01-04 NOTE — Telephone Encounter (Signed)
Patient called asking for refill of all her psychiatric medications. Chart reviewed, explained that she needed an appointment to see a provider since it had been since 2024. Transferred to front desk to make appointment. Message sent to MD to review for refills.

## 2024-01-08 ENCOUNTER — Other Ambulatory Visit (HOSPITAL_COMMUNITY): Payer: Self-pay | Admitting: Student

## 2024-01-08 ENCOUNTER — Other Ambulatory Visit (INDEPENDENT_AMBULATORY_CARE_PROVIDER_SITE_OTHER): Payer: No Payment, Other

## 2024-01-08 ENCOUNTER — Telehealth (HOSPITAL_COMMUNITY): Payer: Self-pay | Admitting: Student

## 2024-01-08 DIAGNOSIS — F3341 Major depressive disorder, recurrent, in partial remission: Secondary | ICD-10-CM

## 2024-01-08 DIAGNOSIS — D61818 Other pancytopenia: Secondary | ICD-10-CM

## 2024-01-08 DIAGNOSIS — Z79899 Other long term (current) drug therapy: Secondary | ICD-10-CM

## 2024-01-08 DIAGNOSIS — F411 Generalized anxiety disorder: Secondary | ICD-10-CM

## 2024-01-08 MED ORDER — SERTRALINE HCL 100 MG PO TABS: 150.0000 mg | ORAL_TABLET | Freq: Every day | ORAL | 2 refills | Status: DC

## 2024-01-08 MED ORDER — PROPRANOLOL HCL 10 MG PO TABS: 10.0000 mg | ORAL_TABLET | Freq: Two times a day (BID) | ORAL | 2 refills | Status: DC

## 2024-01-08 MED ORDER — ARIPIPRAZOLE 10 MG PO TABS: 10.0000 mg | ORAL_TABLET | Freq: Every day | ORAL | 2 refills | Status: DC

## 2024-01-08 MED ORDER — TRAZODONE HCL 150 MG PO TABS: 150.0000 mg | ORAL_TABLET | Freq: Every day | ORAL | 2 refills | Status: DC

## 2024-01-08 NOTE — Progress Notes (Signed)
Called the patient to discuss her request for medications.  She reports that she ran out of medications 3 days ago.  Based on the prescriptions sent in the last appointment, she should have run out in early January.  It appears she was taking previously prescribed medication.  Agreed to refill her medications until the next appointment.  Patient did complete her lab work.  Carlyn Reichert, MD PGY-3

## 2024-01-08 NOTE — Progress Notes (Signed)
Patient presented for labs , labs were tolerated well .

## 2024-02-01 ENCOUNTER — Encounter (HOSPITAL_COMMUNITY): Payer: Self-pay | Admitting: Student

## 2024-02-01 ENCOUNTER — Ambulatory Visit (HOSPITAL_COMMUNITY): Payer: No Payment, Other | Admitting: Student

## 2024-02-01 DIAGNOSIS — D61818 Other pancytopenia: Secondary | ICD-10-CM

## 2024-02-01 DIAGNOSIS — F3341 Major depressive disorder, recurrent, in partial remission: Secondary | ICD-10-CM

## 2024-02-01 DIAGNOSIS — F411 Generalized anxiety disorder: Secondary | ICD-10-CM

## 2024-02-01 DIAGNOSIS — Z79899 Other long term (current) drug therapy: Secondary | ICD-10-CM

## 2024-02-01 MED ORDER — PROPRANOLOL HCL 10 MG PO TABS
10.0000 mg | ORAL_TABLET | Freq: Two times a day (BID) | ORAL | 2 refills | Status: DC
Start: 2024-02-01 — End: 2024-07-11

## 2024-02-01 MED ORDER — TRAZODONE HCL 150 MG PO TABS: 150.0000 mg | ORAL_TABLET | Freq: Every day | ORAL | 2 refills | Status: DC

## 2024-02-01 MED ORDER — SERTRALINE HCL 100 MG PO TABS: 150.0000 mg | ORAL_TABLET | Freq: Every day | ORAL | 2 refills | Status: DC

## 2024-02-01 MED ORDER — ARIPIPRAZOLE 10 MG PO TABS: 10.0000 mg | ORAL_TABLET | Freq: Every day | ORAL | 2 refills | Status: DC

## 2024-02-02 NOTE — Progress Notes (Signed)
 BH MD Outpatient Progress Note  02/02/2024 3:21 PM Michele Black  MRN:  [id number]  Assessment:  Michele Black presents for follow-up evaluation.  The patient was last seen by myself in September 2024.  Despite this long interval, the patient reports consistency in taking her medication by using old medication (refill was also sent in in January).  She feels that she is doing well.  No medication changes planned for this time.  Routine lab work scheduled for later this month.  Will further discuss marijuana use at the next appointment.  Identifying Information: Michele Black is a 63 y.o. y.o. female with a history of major depressive disorder and generalized anxiety disorder who is an established patient with [facility] for management of anxiety.   Plan:  # MDD  GAD Interventions: -- Continue Zoloft 150 mg daily - Continue Abilify 10 mg daily - Continue trazodone 150 mg nightly - Continue propranolol 10 mg twice daily  # Pancytopenia of unknown etiology -- Labs to be obtained in late March - Discussed the need for her to follow-up with a primary care physician  #Long term use of antipsychotic medication -- Labs to be obtained at the end of March - EKG from 1 year ago, normal sinus rhythm with QTc 437 - Discussed the need for her to follow-up with her primary care physician to get an EKG - AIMS 0, 02/02/2024  Patient was given contact information for behavioral health clinic and was instructed to call 911 for emergencies.   Subjective:  Chief Complaint:  Chief Complaint  Patient presents with   Follow-up    Interval History:  Today the patient reports a predominantly euthymic mood.  She reports sleeping 8 hours per night and feels rested in the morning.  She reports a good appetite and denies experiencing any hopelessness or suicidal thoughts.  She reports minimal anxiety and no panic attacks.  She denies experiencing any hallucinations.  She continues to enjoy  her job at the [employer]  Visit Diagnosis:    ICD-10-CM   1. Recurrent major depressive disorder, in partial remission (HCC)  F33.41 traZODone (DESYREL) 150 MG tablet    sertraline (ZOLOFT) 100 MG tablet    ARIPiprazole (ABILIFY) 10 MG tablet    2. Other pancytopenia (HCC)  D61.818 CBC with Differential    Comprehensive Metabolic Panel (CMET)    TSH    3. Long term current use of antipsychotic medication  Z79.899 Lipid panel    Hemoglobin A1c    4. Generalized anxiety disorder  F41.1 sertraline (ZOLOFT) 100 MG tablet    propranolol (INDERAL) 10 MG tablet      Past Psychiatric History:  No history of psychiatric admission or suicide attempts  Past Medical History:  Past Medical History:  Diagnosis Date   Acute myocardial infarction of other anterior wall, initial episode of care    Anxiety    Arthritis    Ischemic cardiomyopathy    Mixed hyperlipidemia    Tobacco use disorder     Past Surgical History:  Procedure Laterality Date   LEFT HEART CATHETERIZATION WITH CORONARY ANGIOGRAM Bilateral 09/05/2013   Procedure: LEFT HEART CATHETERIZATION WITH CORONARY ANGIOGRAM;  Surgeon: Corky Crafts, MD;  Location: [facility] CATH LAB;  Service: Cardiovascular;  Laterality: Bilateral;   PERCUTANEOUS CORONARY STENT INTERVENTION (PCI-S)  09/05/2013   Procedure: PERCUTANEOUS CORONARY STENT INTERVENTION (PCI-S);  Surgeon: Corky Crafts, MD;  Location: [facility] CATH LAB;  Service: Cardiovascular;;  DES to mid LAD  Family Psychiatric History: None pertinent  Family History:  Family History  Problem Relation Age of Onset   Throat cancer Mother    Heart attack Neg Hx     Social History:  Social History   Socioeconomic History   Marital status: Single    Spouse name: Not on file   Number of children: Not on file   Years of education: Not on file   Highest education level: Not on file  Occupational History   Not on file  Tobacco Use   Smoking status: Every Day     Current packs/day: 0.00    Types: Cigarettes    Last attempt to quit: 09/05/2013    Years since quitting: 10.4   Smokeless tobacco: Never  Substance and Sexual Activity   Alcohol use: Yes    Alcohol/week: 1.0 standard drink of alcohol    Types: 1 Shots of liquor per week   Drug use: No   Sexual activity: Not on file  Other Topics Concern   Not on file  Social History Narrative   Not on file   Social Drivers of Health   Financial Resource Strain: Not on file  Food Insecurity: Not on file  Transportation Needs: Not on file  Physical Activity: Not on file  Stress: Not on file  Social Connections: Not on file    Allergies: No Known Allergies  Current Medications: Current Outpatient Medications  Medication Sig Dispense Refill   ARIPiprazole (ABILIFY) 10 MG tablet Take 1 tablet (10 mg total) by mouth daily. 30 tablet 2   aspirin 81 MG chewable tablet Chew 1 tablet (81 mg total) by mouth daily.     nitroGLYCERIN (NITROSTAT) 0.4 MG SL tablet Place 1 tablet (0.4 mg total) under the tongue every 5 (five) minutes as needed for chest pain. 3 DOSES MAX 25 tablet 3   ondansetron (ZOFRAN ODT) 4 MG disintegrating tablet Take 1 tablet (4 mg total) by mouth every 8 (eight) hours as needed for nausea or vomiting. 20 tablet 0   propranolol (INDERAL) 10 MG tablet Take 1 tablet (10 mg total) by mouth 2 (two) times daily. 60 tablet 2   sertraline (ZOLOFT) 100 MG tablet Take 1.5 tablets (150 mg total) by mouth daily. 45 tablet 2   traZODone (DESYREL) 150 MG tablet Take 1 tablet (150 mg total) by mouth at bedtime. 30 tablet 2   No current facility-administered medications for this visit.     Objective:  Psychiatric Specialty Exam: Physical Exam Constitutional:      Appearance: the patient is not toxic-appearing.  Pulmonary:     Effort: Pulmonary effort is normal.  Neurological:     General: No focal deficit present.     Mental Status: the patient is alert and oriented to person, place,  and time.   Review of Systems  Respiratory:  Negative for shortness of breath.   Cardiovascular:  Negative for chest pain.  Gastrointestinal:  Negative for abdominal pain, constipation, diarrhea, nausea and vomiting.  Neurological:  Negative for headaches.      There were no vitals taken for this visit.  General Appearance: Fairly Groomed  Eye Contact:  Good  Speech:  Clear and Coherent  Volume:  Normal  Mood:  Euthymic  Affect:  Congruent  Thought Process:  Coherent  Orientation:  Full (Time, Place, and Person)  Thought Content: Logical   Suicidal Thoughts:  No  Homicidal Thoughts:  No  Memory:  Immediate;   Good  Judgement:  fair  Insight:  fair  Psychomotor Activity:  Normal  Concentration:  Concentration: Good  Recall:  Good  Fund of Knowledge: Good  Language: Good  Akathisia:  No  Handed:    AIMS (if indicated): not done  Assets:  Communication Skills Desire for Improvement Financial Resources/Insurance Housing Leisure Time Physical Health  ADL's:  Intact  Cognition: WNL  Sleep:  Fair     Metabolic Disorder Labs: No results found for: "HGBA1C", "MPG" No results found for: "PROLACTIN" Lab Results  Component Value Date   CHOL 131 08/08/2016   TRIG 68 08/08/2016   HDL 38 (L) 08/08/2016   CHOLHDL 3.4 08/08/2016   VLDL 14 08/08/2016   LDLCALC 79 08/08/2016   LDLCALC 83 02/19/2014   No results found for: "TSH"  Therapeutic Level Labs: No results found for: "LITHIUM" No results found for: "VALPROATE" No results found for: "CBMZ"  Screenings: GAD-7    Flowsheet Row Video Visit from 09/14/2022 in [facility] Video Visit from 06/23/2022 in [facility] Video Visit from 10/21/2021 in [facility] Video Visit from 07/23/2021 in [facility]  Total GAD-7 Score 0 18 6 16       PHQ2-9    Flowsheet Row Video Visit from 09/14/2022 in [facility] Video Visit from 06/23/2022 in [facility] Video Visit from 10/21/2021 in [facility] Video Visit from 07/23/2021 in [facility]  PHQ-2 Total Score 0 4 3 1   PHQ-9 Total Score 0 21 7 5       Flowsheet Row Video Visit from 06/23/2022 in [facility] ED from 11/08/2021 in [facility] Emergency Department at [facility]  C-SSRS RISK CATEGORY Low Risk No Risk       Collaboration of Care: none  A total of 30 minutes was spent involved in face to face clinical care, chart review, documentation.   Carlyn Reichert, MD 02/02/2024, 3:21 PM

## 2024-02-19 ENCOUNTER — Other Ambulatory Visit (HOSPITAL_COMMUNITY)

## 2024-02-26 ENCOUNTER — Other Ambulatory Visit (INDEPENDENT_AMBULATORY_CARE_PROVIDER_SITE_OTHER)

## 2024-02-26 DIAGNOSIS — F418 Other specified anxiety disorders: Secondary | ICD-10-CM

## 2024-02-26 DIAGNOSIS — F411 Generalized anxiety disorder: Secondary | ICD-10-CM

## 2024-02-26 DIAGNOSIS — D61818 Other pancytopenia: Secondary | ICD-10-CM

## 2024-02-26 DIAGNOSIS — Z79899 Other long term (current) drug therapy: Secondary | ICD-10-CM | POA: Diagnosis not present

## 2024-02-26 NOTE — Progress Notes (Unsigned)
 Pt tolerated lab draws well in left arm.     CMA, JNL

## 2024-02-28 LAB — CBC WITH DIFFERENTIAL/PLATELET
Basophils Absolute: 0 10*3/uL (ref 0.0–0.2)
Basos: 1 %
EOS (ABSOLUTE): 0.1 10*3/uL (ref 0.0–0.4)
Eos: 3 %
Hematocrit: 33 % — ABNORMAL LOW (ref 34.0–46.6)
Hemoglobin: 10.9 g/dL — ABNORMAL LOW (ref 11.1–15.9)
Immature Grans (Abs): 0 10*3/uL (ref 0.0–0.1)
Immature Granulocytes: 0 %
Lymphocytes Absolute: 1 10*3/uL (ref 0.7–3.1)
Lymphs: 33 %
MCH: 33.3 pg — ABNORMAL HIGH (ref 26.6–33.0)
MCHC: 33 g/dL (ref 31.5–35.7)
MCV: 101 fL — ABNORMAL HIGH (ref 79–97)
Monocytes Absolute: 0.2 10*3/uL (ref 0.1–0.9)
Monocytes: 6 %
Neutrophils Absolute: 1.7 10*3/uL (ref 1.4–7.0)
Neutrophils: 57 %
Platelets: 152 10*3/uL (ref 150–450)
RBC: 3.27 x10E6/uL — ABNORMAL LOW (ref 3.77–5.28)
RDW: 13.1 % (ref 11.7–15.4)
WBC: 2.9 10*3/uL — ABNORMAL LOW (ref 3.4–10.8)

## 2024-02-28 LAB — COMPREHENSIVE METABOLIC PANEL WITH GFR
ALT: 10 IU/L (ref 0–32)
AST: 14 IU/L (ref 0–40)
Albumin: 3.9 g/dL (ref 3.9–4.9)
Alkaline Phosphatase: 54 IU/L (ref 44–121)
BUN/Creatinine Ratio: 16 (ref 12–28)
BUN: 14 mg/dL (ref 8–27)
Bilirubin Total: <0.2 mg/dL (ref 0.0–1.2)
CO2: 23 mmol/L (ref 20–29)
Calcium: 9 mg/dL (ref 8.7–10.3)
Chloride: 103 mmol/L (ref 96–106)
Creatinine, Ser: 0.9 mg/dL (ref 0.57–1.00)
Globulin, Total: 3.7 g/dL (ref 1.5–4.5)
Glucose: 93 mg/dL (ref 70–99)
Potassium: 4.2 mmol/L (ref 3.5–5.2)
Sodium: 135 mmol/L (ref 134–144)
Total Protein: 7.6 g/dL (ref 6.0–8.5)
eGFR: 72 mL/min/{1.73_m2} (ref >59)

## 2024-02-28 LAB — HEMOGLOBIN A1C
Est. average glucose Bld gHb Est-mCnc: 111 mg/dL
Hgb A1c MFr Bld: 5.5 % (ref 4.8–5.6)

## 2024-02-28 LAB — LIPID PANEL
Chol/HDL Ratio: 4.2 ratio (ref 0.0–4.4)
Cholesterol, Total: 167 mg/dL (ref 100–199)
HDL: 40 mg/dL (ref >39)
LDL Chol Calc (NIH): 98 mg/dL (ref 0–99)
Triglycerides: 168 mg/dL — ABNORMAL HIGH (ref 0–149)
VLDL Cholesterol Cal: 29 mg/dL (ref 5–40)

## 2024-02-28 LAB — TSH: TSH: 1.48 u[IU]/mL (ref 0.450–4.500)

## 2024-04-24 NOTE — Progress Notes (Deleted)
 [facility] MD Outpatient Progress Note  04/25/2024 Michele Black  MRN:  [id number]  Assessment:  Michele Black presents for follow-up evaluation.    Lv - triad, 3/13 Well, refills Zol, ab, traz, propr  CR Labs 01/2024 w 2.9/10.9/152, still bad [name] since last visit, no pcp  T Pcp-EKG MJ, not covered last time  P New res=[name]  Identifying Information: Michele Black is a 63 y.o. y.o. female with a history of major depressive disorder and generalized anxiety disorder who is an established patient with [facility] for management of anxiety.   Plan:  # MDD  GAD Interventions: -- Continue Zoloft  150 mg daily - Continue Abilify  10 mg daily - Continue trazodone  150 mg nightly - Continue propranolol  10 mg twice daily  # Pancytopenia of unknown etiology -- Labs to be obtained in late March - Discussed the need for her to follow-up with a primary care physician  #Long term use of antipsychotic medication -- Labs to be obtained at the end of March - EKG from 1 year ago, normal sinus rhythm with QTc 437 - Discussed the need for her to follow-up with her primary care physician to get an EKG - AIMS 0, 02/02/2024  Patient was given contact information for behavioral health clinic and was instructed to call 911 for emergencies.   Subjective:  Chief Complaint:  No chief complaint on file.   Interval History:  Today the patient reports a predominantly euthymic mood.  She reports sleeping 8 hours per night and feels rested in the morning.  She reports a good appetite and denies experiencing any hopelessness or suicidal thoughts.  She reports minimal anxiety and no panic attacks.  She denies experiencing any hallucinations.  She continues to enjoy her job at the [employer]  Visit Diagnosis:  No diagnosis found.   Past Psychiatric History:  No history of psychiatric admission or suicide attempts  Past Medical History:  Past Medical History:  Diagnosis Date    Acute myocardial infarction of other anterior wall, initial episode of care    Anxiety    Arthritis    Ischemic cardiomyopathy    Mixed hyperlipidemia    Tobacco use disorder     Past Surgical History:  Procedure Laterality Date   LEFT HEART CATHETERIZATION WITH CORONARY ANGIOGRAM Bilateral 09/05/2013   Procedure: LEFT HEART CATHETERIZATION WITH CORONARY ANGIOGRAM;  Surgeon: Lucendia Rusk, MD;  Location: [facility] CATH LAB;  Service: Cardiovascular;  Laterality: Bilateral;   PERCUTANEOUS CORONARY STENT INTERVENTION (PCI-S)  09/05/2013   Procedure: PERCUTANEOUS CORONARY STENT INTERVENTION (PCI-S);  Surgeon: Lucendia Rusk, MD;  Location: [facility] CATH LAB;  Service: Cardiovascular;;  DES to mid LAD    Family Psychiatric History: None pertinent  Family History:  Family History  Problem Relation Age of Onset   Throat cancer Mother    Heart attack Neg Hx     Social History:  Social History   Socioeconomic History   Marital status: Single    Spouse name: Not on file   Number of children: Not on file   Years of education: Not on file   Highest education level: Not on file  Occupational History   Not on file  Tobacco Use   Smoking status: Every Day    Current packs/day: 0.00    Types: Cigarettes    Last attempt to quit: 09/05/2013    Years since quitting: 10.6   Smokeless tobacco: Never  Substance and Sexual Activity   Alcohol use: Yes  Alcohol/week: 1.0 standard drink of alcohol    Types: 1 Shots of liquor per week   Drug use: No   Sexual activity: Not on file  Other Topics Concern   Not on file  Social History Narrative   Not on file   Social Drivers of Health   Financial Resource Strain: Not on file  Food Insecurity: Not on file  Transportation Needs: Not on file  Physical Activity: Not on file  Stress: Not on file  Social Connections: Not on file    Allergies: No Known Allergies  Current Medications: Current Outpatient Medications  Medication Sig  Dispense Refill   ARIPiprazole  (ABILIFY ) 10 MG tablet Take 1 tablet (10 mg total) by mouth daily. 30 tablet 2   aspirin  81 MG chewable tablet Chew 1 tablet (81 mg total) by mouth daily.     nitroGLYCERIN  (NITROSTAT ) 0.4 MG SL tablet Place 1 tablet (0.4 mg total) under the tongue every 5 (five) minutes as needed for chest pain. 3 DOSES MAX 25 tablet 3   ondansetron  (ZOFRAN  ODT) 4 MG disintegrating tablet Take 1 tablet (4 mg total) by mouth every 8 (eight) hours as needed for nausea or vomiting. 20 tablet 0   propranolol  (INDERAL ) 10 MG tablet Take 1 tablet (10 mg total) by mouth 2 (two) times daily. 60 tablet 2   sertraline  (ZOLOFT ) 100 MG tablet Take 1.5 tablets (150 mg total) by mouth daily. 45 tablet 2   traZODone  (DESYREL ) 150 MG tablet Take 1 tablet (150 mg total) by mouth at bedtime. 30 tablet 2   No current facility-administered medications for this visit.     Objective:  Psychiatric Specialty Exam: Physical Exam Constitutional:      Appearance: the patient is not toxic-appearing.  Pulmonary:     Effort: Pulmonary effort is normal.  Neurological:     General: No focal deficit present.     Mental Status: the patient is alert and oriented to person, place, and time.   Review of Systems  Respiratory:  Negative for shortness of breath.   Cardiovascular:  Negative for chest pain.  Gastrointestinal:  Negative for abdominal pain, constipation, diarrhea, nausea and vomiting.  Neurological:  Negative for headaches.      There were no vitals taken for this visit.  General Appearance: Fairly Groomed  Eye Contact:  Good  Speech:  Clear and Coherent  Volume:  Normal  Mood:  Euthymic  Affect:  Congruent  Thought Process:  Coherent  Orientation:  Full (Time, Place, and Person)  Thought Content: Logical   Suicidal Thoughts:  No  Homicidal Thoughts:  No  Memory:  Immediate;   Good  Judgement:  fair  Insight:  fair  Psychomotor Activity:  Normal  Concentration:  Concentration:  Good  Recall:  Good  Fund of Knowledge: Good  Language: Good  Akathisia:  No  Handed:    AIMS (if indicated): not done  Assets:  Communication Skills Desire for Improvement Financial Resources/Insurance Housing Leisure Time Physical Health  ADL's:  Intact  Cognition: WNL  Sleep:  Fair     Metabolic Disorder Labs: Lab Results  Component Value Date   HGBA1C 5.5 02/26/2024   No results found for: "PROLACTIN" Lab Results  Component Value Date   CHOL 167 02/26/2024   TRIG 168 (H) 02/26/2024   HDL 40 02/26/2024   CHOLHDL 4.2 02/26/2024   VLDL 14 08/08/2016   LDLCALC 98 02/26/2024   LDLCALC 79 08/08/2016   Lab Results  Component Value Date  TSH 1.480 02/26/2024    Therapeutic Level Labs: No results found for: "LITHIUM" No results found for: "VALPROATE" No results found for: "CBMZ"  Screenings: GAD-7    Flowsheet Row Video Visit from 09/14/2022 in [facility] Video Visit from 06/23/2022 in [facility] Video Visit from 10/21/2021 in [facility] Video Visit from 07/23/2021 in [facility]  Total GAD-7 Score 0 18 6 16       PHQ2-9    Flowsheet Row Video Visit from 09/14/2022 in [facility] Video Visit from 06/23/2022 in [facility] Video Visit from 10/21/2021 in [facility] Video Visit from 07/23/2021 in [facility]  PHQ-2 Total Score 0 4 3 1   PHQ-9 Total Score 0 21 7 5       Flowsheet Row Video Visit from 06/23/2022 in [facility] ED from 11/08/2021 in [facility] Emergency Department at [facility]  C-SSRS RISK CATEGORY Low Risk No Risk       Collaboration of Care: none  A total of 30 minutes was spent involved in face to face clinical care, chart review, documentation.   Marilou Showman, MD 04/24/2024,  2:18 PM

## 2024-04-25 ENCOUNTER — Encounter (HOSPITAL_COMMUNITY): Admitting: Student

## 2024-05-02 DIAGNOSIS — Z419 Encounter for procedure for purposes other than remedying health state, unspecified: Secondary | ICD-10-CM | POA: Diagnosis not present

## 2024-05-18 NOTE — Progress Notes (Deleted)
 [facility] MD Outpatient Progress Note  04/25/2024 Michele Black  MRN:  [id number]  Assessment:  Michele Black presents for follow-up evaluation.    Lv - triad, 3/13 Well, refills Zol, ab, traz, propr  CR Labs 01/2024 w 2.9/10.9/152, still bad Michele Black since last visit, no pcp  T Pcp-EKG Michele Black, not covered last time  P New res=Michele Black  Identifying Information: Michele Black is a 63 y.o. y.o. female with a history of major depressive disorder and generalized anxiety disorder who is an established patient with [facility] for management of anxiety.   Plan:  # MDD  GAD Interventions: -- Continue Zoloft  150 mg daily - Continue Abilify  10 mg daily - Continue trazodone  150 mg nightly - Continue propranolol  10 mg twice daily  # Pancytopenia of unknown etiology -- Labs to be obtained in late March - Discussed the need for her to follow-up with a primary care physician  #Long term use of antipsychotic medication -- Labs to be obtained at the end of March - EKG from 1 year ago, normal sinus rhythm with QTc 437 - Discussed the need for her to follow-up with her primary care physician to get an EKG - AIMS 0, 02/02/2024  Patient was given contact information for behavioral health clinic and was instructed to call 911 for emergencies.   Subjective:  Chief Complaint:  No chief complaint on file.   Interval History:  Today the patient reports a predominantly euthymic mood.  She reports sleeping 8 hours per night and feels rested in the morning.  She reports a good appetite and denies experiencing any hopelessness or suicidal thoughts.  She reports minimal anxiety and no panic attacks.  She denies experiencing any hallucinations.  She continues to enjoy her job at the [employer]  Visit Diagnosis:  No diagnosis found.   Past Psychiatric History:  No history of psychiatric admission or suicide attempts  Past Medical History:  Past Medical History:  Diagnosis Date    Acute myocardial infarction of other anterior wall, initial episode of care    Anxiety    Arthritis    Ischemic cardiomyopathy    Mixed hyperlipidemia    Tobacco use disorder     Past Surgical History:  Procedure Laterality Date   LEFT HEART CATHETERIZATION WITH CORONARY ANGIOGRAM Bilateral 09/05/2013   Procedure: LEFT HEART CATHETERIZATION WITH CORONARY ANGIOGRAM;  Surgeon: Michele GORMAN Reek, MD;  Location: [facility] CATH LAB;  Service: Cardiovascular;  Laterality: Bilateral;   PERCUTANEOUS CORONARY STENT INTERVENTION (PCI-S)  09/05/2013   Procedure: PERCUTANEOUS CORONARY STENT INTERVENTION (PCI-S);  Surgeon: Michele GORMAN Reek, MD;  Location: [facility] CATH LAB;  Service: Cardiovascular;;  DES to mid LAD    Family Psychiatric History: None pertinent  Family History:  Family History  Problem Relation Age of Onset   Throat cancer Mother    Heart attack Neg Hx     Social History:  Social History   Socioeconomic History   Marital status: Single    Spouse name: Not on file   Number of children: Not on file   Years of education: Not on file   Highest education level: Not on file  Occupational History   Not on file  Tobacco Use   Smoking status: Every Day    Current packs/day: 0.00    Types: Cigarettes    Last attempt to quit: 09/05/2013    Years since quitting: 10.7   Smokeless tobacco: Never  Substance and Sexual Activity   Alcohol use: Yes  Alcohol/week: 1.0 standard drink of alcohol    Types: 1 Shots of liquor per week   Drug use: No   Sexual activity: Not on file  Other Topics Concern   Not on file  Social History Narrative   Not on file   Social Drivers of Health   Financial Resource Strain: Not on file  Food Insecurity: Not on file  Transportation Needs: Not on file  Physical Activity: Not on file  Stress: Not on file  Social Connections: Not on file    Allergies: No Known Allergies  Current Medications: Current Outpatient Medications  Medication Sig  Dispense Refill   ARIPiprazole  (ABILIFY ) 10 MG tablet Take 1 tablet (10 mg total) by mouth daily. 30 tablet 2   aspirin  81 MG chewable tablet Chew 1 tablet (81 mg total) by mouth daily.     nitroGLYCERIN  (NITROSTAT ) 0.4 MG SL tablet Place 1 tablet (0.4 mg total) under the tongue every 5 (five) minutes as needed for chest pain. 3 DOSES MAX 25 tablet 3   ondansetron  (ZOFRAN  ODT) 4 MG disintegrating tablet Take 1 tablet (4 mg total) by mouth every 8 (eight) hours as needed for nausea or vomiting. 20 tablet 0   propranolol  (INDERAL ) 10 MG tablet Take 1 tablet (10 mg total) by mouth 2 (two) times daily. 60 tablet 2   sertraline  (ZOLOFT ) 100 MG tablet Take 1.5 tablets (150 mg total) by mouth daily. 45 tablet 2   traZODone  (DESYREL ) 150 MG tablet Take 1 tablet (150 mg total) by mouth at bedtime. 30 tablet 2   No current facility-administered medications for this visit.     Objective:  Psychiatric Specialty Exam: Physical Exam Constitutional:      Appearance: the patient is not toxic-appearing.  Pulmonary:     Effort: Pulmonary effort is normal.  Neurological:     General: No focal deficit present.     Mental Status: the patient is alert and oriented to person, place, and time.   Review of Systems  Respiratory:  Negative for shortness of breath.   Cardiovascular:  Negative for chest pain.  Gastrointestinal:  Negative for abdominal pain, constipation, diarrhea, nausea and vomiting.  Neurological:  Negative for headaches.      There were no vitals taken for this visit.  General Appearance: Fairly Groomed  Eye Contact:  Good  Speech:  Clear and Coherent  Volume:  Normal  Mood:  Euthymic  Affect:  Congruent  Thought Process:  Coherent  Orientation:  Full (Time, Place, and Person)  Thought Content: Logical   Suicidal Thoughts:  No  Homicidal Thoughts:  No  Memory:  Immediate;   Good  Judgement:  fair  Insight:  fair  Psychomotor Activity:  Normal  Concentration:  Concentration:  Good  Recall:  Good  Fund of Knowledge: Good  Language: Good  Akathisia:  No  Handed:    AIMS (if indicated): not done  Assets:  Communication Skills Desire for Improvement Financial Resources/Insurance Housing Leisure Time Physical Health  ADL's:  Intact  Cognition: WNL  Sleep:  Fair     Metabolic Disorder Labs: Lab Results  Component Value Date   HGBA1C 5.5 02/26/2024   No results found for: PROLACTIN Lab Results  Component Value Date   CHOL 167 02/26/2024   TRIG 168 (H) 02/26/2024   HDL 40 02/26/2024   CHOLHDL 4.2 02/26/2024   VLDL 14 08/08/2016   LDLCALC 98 02/26/2024   LDLCALC 79 08/08/2016   Lab Results  Component Value Date  TSH 1.480 02/26/2024    Therapeutic Level Labs: No results found for: LITHIUM No results found for: VALPROATE No results found for: CBMZ  Screenings: GAD-7    Flowsheet Row Video Visit from 09/14/2022 in [facility] Video Visit from 06/23/2022 in [facility] Video Visit from 10/21/2021 in [facility] Video Visit from 07/23/2021 in [facility]  Total GAD-7 Score 0 18 6 16    PHQ2-9    Flowsheet Row Video Visit from 09/14/2022 in [facility] Video Visit from 06/23/2022 in [facility] Video Visit from 10/21/2021 in [facility] Video Visit from 07/23/2021 in [facility]  PHQ-2 Total Score 0 4 3 1   PHQ-9 Total Score 0 21 7 5    Flowsheet Row Video Visit from 06/23/2022 in [facility] ED from 11/08/2021 in [facility] Emergency Department at [facility]  C-SSRS RISK CATEGORY Low Risk No Risk    Collaboration of Care: none  A total of 30 minutes was spent involved in face to face clinical care, chart review, documentation.   Karleen Kaufmann, MD 05/18/2024, 1:18 PM

## 2024-05-28 ENCOUNTER — Encounter (HOSPITAL_COMMUNITY): Admitting: Psychiatry

## 2024-06-01 DIAGNOSIS — Z419 Encounter for procedure for purposes other than remedying health state, unspecified: Secondary | ICD-10-CM | POA: Diagnosis not present

## 2024-07-02 DIAGNOSIS — Z419 Encounter for procedure for purposes other than remedying health state, unspecified: Secondary | ICD-10-CM | POA: Diagnosis not present

## 2024-07-03 NOTE — Progress Notes (Unsigned)
 [facility] MD Outpatient Progress Note  07/03/2024 10:53 AM Michele Black  MRN:  [id number]  Assessment:  [place] presents for follow-up evaluation.  The patient was last seen by myself in September 2024.  Despite this long interval, the patient reports consistency in taking her medication by using old medication (refill was also sent in in January).  She feels that she is doing well.  No medication changes planned for this time.  Routine lab work scheduled for later this month.  Will further discuss marijuana use at the next appointment.  Identifying Information: Michele Black is a 63 y.o. y.o. female with a history of major depressive disorder and generalized anxiety disorder who is an established patient with [facility] for management of anxiety.   Plan:  # MDD  GAD Interventions: -- Continue Zoloft  150 mg daily  -- CBC: macrocytic anemia, order vitamin b12 and folate*** - Continue Abilify  10 mg daily  -- Antipsychotic monitoring labs updated on 02/2024. A1c: 5.5 Lipid panel: triglycerides 168   - EKG from 1 year ago, normal sinus rhythm with QTc 437 - AIMS 0, 02/02/2024 - Continue trazodone  150 mg nightly - Continue propranolol  10 mg twice daily  # Pancytopenia of unknown etiology - Discussed the need for her to follow-up with a primary care physician  Patient was given contact information for behavioral health clinic and was instructed to call 911 for emergencies.   Subjective:  Chief Complaint:  No chief complaint on file.   Interval History:  Patient seen ***.  Patient reports feeling *** today. Since the previous visit, ***. Regarding medications, patient notes ***. Patient reports the following adverse effects: ***.   Patient reports *** sleep, ***. Patient reports *** appetite, ***.   Patient rates anxiety a ***/10, depression a ***/10, and anger a ***/10.   Patient denies current SI, HI, and AVH. ***  Stressors include ***.   Substance use:  *** Marijuana?   Visit Diagnosis:  No diagnosis found.   Past Psychiatric History: No history of psychiatric admission or suicide attempts  Family Psychiatric History: None pertinent  Social History: She lives with her daughter and works as a Conservation officer, nature at [employer]  Past Medical History:  Past Medical History:  Diagnosis Date   Acute myocardial infarction of other anterior wall, initial episode of care    Anxiety    Arthritis    Ischemic cardiomyopathy    Mixed hyperlipidemia    Tobacco use disorder     Past Surgical History:  Procedure Laterality Date   LEFT HEART CATHETERIZATION WITH CORONARY ANGIOGRAM Bilateral 09/05/2013   Procedure: LEFT HEART CATHETERIZATION WITH CORONARY ANGIOGRAM;  Surgeon: Candyce GORMAN Reek, MD;  Location[facility] CATH LAB;  Service: Cardiovascular;  Laterality: Bilateral;   PERCUTANEOUS CORONARY STENT INTERVENTION (PCI-S)  09/05/2013   Procedure: PERCUTANEOUS CORONARY STENT INTERVENTION (PCI-S);  Surgeon: Candyce GORMAN Reek, MD;  Location: [facility] CATH LAB;  Service: Cardiovascular;;  DES to mid LAD    Family History:  Family History  Problem Relation Age of Onset   Throat cancer Mother    Heart attack Neg Hx     Social History:  Social History   Socioeconomic History   Marital status: Single    Spouse name: Not on file   Number of children: Not on file   Years of education: Not on file   Highest education level: Not on file  Occupational History   Not on file  Tobacco Use   Smoking  status: Every Day    Current packs/day: 0.00    Types: Cigarettes    Last attempt to quit: 09/05/2013    Years since quitting: 10.8   Smokeless tobacco: Never  Substance and Sexual Activity   Alcohol use: Yes    Alcohol/week: 1.0 standard drink of alcohol    Types: 1 Shots of liquor per week   Drug use: No   Sexual activity: Not on file  Other Topics Concern   Not on file  Social History Narrative   Not on file   Social  Drivers of Health   Financial Resource Strain: Not on file  Food Insecurity: Not on file  Transportation Needs: Not on file  Physical Activity: Not on file  Stress: Not on file  Social Connections: Not on file    Allergies: No Known Allergies  Current Medications: Current Outpatient Medications  Medication Sig Dispense Refill   ARIPiprazole  (ABILIFY ) 10 MG tablet Take 1 tablet (10 mg total) by mouth daily. 30 tablet 2   aspirin  81 MG chewable tablet Chew 1 tablet (81 mg total) by mouth daily.     nitroGLYCERIN  (NITROSTAT ) 0.4 MG SL tablet Place 1 tablet (0.4 mg total) under the tongue every 5 (five) minutes as needed for chest pain. 3 DOSES MAX 25 tablet 3   ondansetron  (ZOFRAN  ODT) 4 MG disintegrating tablet Take 1 tablet (4 mg total) by mouth every 8 (eight) hours as needed for nausea or vomiting. 20 tablet 0   propranolol  (INDERAL ) 10 MG tablet Take 1 tablet (10 mg total) by mouth 2 (two) times daily. 60 tablet 2   sertraline  (ZOLOFT ) 100 MG tablet Take 1.5 tablets (150 mg total) by mouth daily. 45 tablet 2   traZODone  (DESYREL ) 150 MG tablet Take 1 tablet (150 mg total) by mouth at bedtime. 30 tablet 2   No current facility-administered medications for this visit.     Objective:  Psychiatric Specialty Exam: General Appearance: appears at stated age, casually dressed and groomed ***  Behavior: pleasant and cooperative ***  Psychomotor Activity: no psychomotor agitation or retardation noted ***  Eye Contact: fair *** Speech: normal amount, volume and fluency ***   Mood: euthymic *** Affect: congruent, pleasant and interactive ***  Thought Process: linear, goal directed, no circumstantial or tangential thought process noted, no racing thoughts or flight of ideas *** Descriptions of Associations: intact ***  Thought Content Hallucinations: denies AH, VH , does not appear responding to stimuli *** Delusions: no paranoia, delusions of control, grandeur, ideas of  reference, thought broadcasting, and magical thinking *** Suicidal Thoughts: denies SI, intention, plan *** Homicidal Thoughts: denies HI, intention, plan ***  Alertness/Orientation: alert and fully oriented ***  Insight: fair*** Judgment: fair***  Memory: intact ***  Executive Functions  Concentration: intact *** Attention Span: fair *** Recall: intact *** Fund of Knowledge: fair ***  Physical Exam *** General: Pleasant, well-appearing ***. No acute distress. Pulmonary: Normal effort. No wheezing or rales. Skin: No obvious rash or lesions. Neuro: A&Ox3.No focal deficit.  Review of Systems *** No reported symptoms  Metabolic Disorder Labs: Lab Results  Component Value Date   HGBA1C 5.5 02/26/2024   No results found for: PROLACTIN Lab Results  Component Value Date   CHOL 167 02/26/2024   TRIG 168 (H) 02/26/2024   HDL 40 02/26/2024   CHOLHDL 4.2 02/26/2024   VLDL 14 08/08/2016   LDLCALC 98 02/26/2024   LDLCALC 79 08/08/2016   Lab Results  Component Value Date  TSH 1.480 02/26/2024    Therapeutic Level Labs: No results found for: LITHIUM No results found for: VALPROATE No results found for: CBMZ  Screenings: GAD-7    Flowsheet Row Video Visit from 09/14/2022 in [facility] Video Visit from 06/23/2022 in [facility] Video Visit from 10/21/2021 in [facility] Video Visit from 07/23/2021 in [facility]  Total GAD-7 Score 0 18 6 16    PHQ2-9    Flowsheet Row Video Visit from 09/14/2022 in [facility] Video Visit from 06/23/2022 in [facility] Video Visit from 10/21/2021 in [facility] Video Visit from 07/23/2021 in [facility]  PHQ-2 Total Score 0 4 3 1   PHQ-9 Total Score 0 21 7 5    Flowsheet Row Video Visit from 06/23/2022 in [facility] ED from 11/08/2021 in [facility] Emergency Department at [facility]  C-SSRS RISK CATEGORY Low Risk No Risk    Collaboration of Care: none  A total of 30 minutes was spent involved in face to face clinical care, chart review, documentation.   Ismael KATHEE Franco, MD 07/03/2024, 10:53 AM

## 2024-07-11 ENCOUNTER — Ambulatory Visit (INDEPENDENT_AMBULATORY_CARE_PROVIDER_SITE_OTHER): Admitting: Psychiatry

## 2024-07-11 VITALS — BP 141/88 | Ht 65.0 in | Wt 150.4 lb

## 2024-07-11 DIAGNOSIS — F3341 Major depressive disorder, recurrent, in partial remission: Secondary | ICD-10-CM

## 2024-07-11 DIAGNOSIS — F418 Other specified anxiety disorders: Secondary | ICD-10-CM | POA: Diagnosis not present

## 2024-07-11 DIAGNOSIS — F411 Generalized anxiety disorder: Secondary | ICD-10-CM | POA: Diagnosis not present

## 2024-07-11 DIAGNOSIS — Z5181 Encounter for therapeutic drug level monitoring: Secondary | ICD-10-CM

## 2024-07-11 MED ORDER — TRAZODONE HCL 150 MG PO TABS
150.0000 mg | ORAL_TABLET | Freq: Every day | ORAL | 1 refills | Status: DC
Start: 2024-07-11 — End: 2024-09-12

## 2024-07-11 MED ORDER — SERTRALINE HCL 100 MG PO TABS: 150.0000 mg | ORAL_TABLET | Freq: Every day | ORAL | 1 refills | Status: DC

## 2024-07-11 MED ORDER — ARIPIPRAZOLE 5 MG PO TABS: 5.0000 mg | ORAL_TABLET | Freq: Every day | ORAL | 1 refills | Status: DC

## 2024-07-11 MED ORDER — PROPRANOLOL HCL 10 MG PO TABS: 10.0000 mg | ORAL_TABLET | Freq: Two times a day (BID) | ORAL | 1 refills | Status: DC

## 2024-07-11 NOTE — Patient Instructions (Addendum)
 Thank you for attending your appointment today.  -- decrease Abilify  to 5 mg daily -- Continue other medications as prescribed.  Please do not make any changes to medications without first discussing with your provider. If you are experiencing a psychiatric emergency, please call 911 or present to your nearest emergency department. Additional crisis, medication management, and therapy resources are included below.  [facility]  [address], [place], [place] [address] [phone] WALK-IN URGENT CARE 24/7 FOR ANYONE [address] [place], [place]  [phone] Fax: [phone] [email or web address] *Interpreters available *Accepts all insurance and uninsured for Urgent Care needs *Accepts Medicaid and uninsured for outpatient treatment (below)      ONLY FOR [facility]  Below:    Outpatient New Patient Assessment/Therapy Walk-ins:        Monday, Wednesday, and Thursday 8am until slots are full (first come, first served)                   New Patient Psychiatry/Medication Management        Monday-Friday 8am-11am (first come, first served)               For all walk-ins we ask that you arrive by 7:15am, because patients will be seen in the order of arrival.

## 2024-07-15 ENCOUNTER — Other Ambulatory Visit (HOSPITAL_COMMUNITY)

## 2024-08-02 DIAGNOSIS — Z419 Encounter for procedure for purposes other than remedying health state, unspecified: Secondary | ICD-10-CM | POA: Diagnosis not present

## 2024-08-13 NOTE — Progress Notes (Signed)
 This encounter was created in error - please disregard.

## 2024-08-15 ENCOUNTER — Telehealth (HOSPITAL_COMMUNITY): Admitting: Psychiatry

## 2024-08-22 ENCOUNTER — Encounter (HOSPITAL_COMMUNITY): Payer: Self-pay

## 2024-08-22 ENCOUNTER — Encounter (HOSPITAL_COMMUNITY): Admitting: Psychiatry

## 2024-08-22 NOTE — Progress Notes (Deleted)
 BH MD Outpatient Progress Note  08/22/2024 2:42 PM Michele Black  MRN:  [id number]  Assessment:  Kynlea Blackston presents for follow-up evaluation. In the prior visit,  I discussed starting to taper off Abilify  as she has been doing well and may not need adjunctive therapy with Abilify  and ordered vitamin b12 and folate due to CBC having macrocytic anemia. ***   Identifying Information: Michele Black is a 63 y.o. y.o. female with a history of major depressive disorder and generalized anxiety disorder who is an established patient with [facility] for management of anxiety.   Plan:  # MDD  GAD Interventions: -- Continue Zoloft  150 mg daily  -- CBC: macrocytic anemia, ordered vitamin b12 and folate - Abilify  5 mg daily  -- Antipsychotic monitoring labs updated on 02/2024. A1c: 5.5 Lipid panel: triglycerides 168   - EKG from 1 year ago, normal sinus rhythm with QTc 437 - AIMS 0, 02/02/2024 - Continue trazodone  150 mg nightly - Continue propranolol  10 mg twice daily  # Pancytopenia of unknown etiology - Discussed the need for her to follow-up with a primary care physician  Patient was given contact information for behavioral health clinic and was instructed to call 911 for emergencies.   Subjective:   Interval History:  Patient seen ***.  Patient reports feeling *** today. Since the previous visit, ***. Stressors include ***.   Regarding psychiatric symptoms, ***. Patient reports the medications are ***. Patient reports the following adverse effects: ***.   Patient reports *** sleep, ***. Patient reports *** appetite, ***.   Patient denies current SI, HI, and AVH. ***  Substance use***: denies tobacco use- quit over one year ago Denies alcohol use Denies marijuana use Denies illicit substance  Visit Diagnosis:  No diagnosis found.  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, 4 children Support is her son  Past Medical History:  Past Medical History:  Diagnosis Date   Acute myocardial infarction of other anterior wall, initial episode of care    Anxiety    Arthritis    Ischemic cardiomyopathy    Mixed hyperlipidemia    Tobacco use disorder     Past Surgical History:  Procedure Laterality Date   LEFT HEART CATHETERIZATION WITH CORONARY ANGIOGRAM Bilateral 09/05/2013   Procedure: LEFT HEART CATHETERIZATION WITH CORONARY ANGIOGRAM;  Surgeon: Candyce GORMAN Reek, MD;  Location: [facility] CATH LAB;  Service: Cardiovascular;  Laterality: Bilateral;   PERCUTANEOUS CORONARY STENT INTERVENTION (PCI-S)  09/05/2013   Procedure: PERCUTANEOUS CORONARY STENT INTERVENTION (PCI-S);  Surgeon: Candyce GORMAN Reek, MD;  Location: [facility] CATH LAB;  Service: Cardiovascular;;  DES to mid LAD    Family History:  Family History  Problem Relation Age of Onset   Throat cancer Mother    Heart attack Neg Hx     Social History   Socioeconomic History   Marital status: Single    Spouse name: Not on file   Number of children: Not on file   Years of education: Not on file   Highest education level: Not on file  Occupational History   Not on file  Tobacco Use   Smoking status: Every Day    Current packs/day: 0.00    Types: Cigarettes    Last attempt to quit: 09/05/2013    Years since quitting: 10.9   Smokeless tobacco: Never  Substance and Sexual  Activity   Alcohol use: Yes    Alcohol/week: 1.0 standard drink of alcohol    Types: 1 Shots of liquor per week   Drug use: No   Sexual activity: Not on file  Other Topics Concern   Not on file  Social History Narrative   Not on file   Social Drivers of Health   Financial Resource Strain: Not on file  Food Insecurity: Not on file  Transportation Needs: Not on file  Physical Activity: Not on file  Stress: Not on file  Social Connections:  Not on file    Allergies: No Known Allergies  Current Medications: Current Outpatient Medications  Medication Sig Dispense Refill   ARIPiprazole  (ABILIFY ) 5 MG tablet Take 1 tablet (5 mg total) by mouth daily. 30 tablet 1   propranolol  (INDERAL ) 10 MG tablet Take 1 tablet (10 mg total) by mouth 2 (two) times daily. 60 tablet 1   sertraline  (ZOLOFT ) 100 MG tablet Take 1.5 tablets (150 mg total) by mouth daily. 45 tablet 1   traZODone  (DESYREL ) 150 MG tablet Take 1 tablet (150 mg total) by mouth at bedtime. 30 tablet 1   No current facility-administered medications for this visit.     Objective:  Psychiatric Specialty Exam: General Appearance: appears at stated age, casually dressed and groomed ***  Behavior: pleasant and cooperative ***  Psychomotor Activity: no psychomotor agitation or retardation noted ***  Eye Contact: fair *** Speech: normal amount, volume and fluency ***   Mood: euthymic *** Affect: congruent, pleasant and interactive ***  Thought Process: linear, goal directed, no circumstantial or tangential thought process noted, no racing thoughts or flight of ideas *** Descriptions of Associations: intact ***  Thought Content Hallucinations: denies AH, VH , does not appear responding to stimuli *** Delusions: no paranoia, delusions of control, grandeur, ideas of reference, thought broadcasting, and magical thinking *** Suicidal Thoughts: denies SI, intention, plan *** Homicidal Thoughts: denies HI, intention, plan ***  Alertness/Orientation: alert and fully oriented ***  Insight: fair*** Judgment: fair***  Memory: intact ***  Executive Functions  Concentration: intact *** Attention Span: fair *** Recall: intact *** Fund of Knowledge: fair ***  Physical Exam *** General: Pleasant, well-appearing ***. No acute distress. Pulmonary: Normal effort. No wheezing or rales. Skin: No obvious rash or lesions. Neuro: A&Ox3.No focal deficit.  Review of Systems  *** No reported symptoms   Metabolic Disorder Labs: Lab Results  Component Value Date   HGBA1C 5.5 02/26/2024   No results found for: PROLACTIN Lab Results  Component Value Date   CHOL 167 02/26/2024   TRIG 168 (H) 02/26/2024   HDL 40 02/26/2024   CHOLHDL 4.2 02/26/2024   VLDL 14 08/08/2016   LDLCALC 98 02/26/2024   LDLCALC 79 08/08/2016   Lab Results  Component Value Date   TSH 1.480 02/26/2024    Therapeutic Level Labs: No results found for: LITHIUM No results found for: VALPROATE No results found for: CBMZ  Screenings: GAD-7    Flowsheet Row Video Visit from 09/14/2022 in [facility] Video Visit from 06/23/2022 in [facility] Video Visit from 10/21/2021 in [facility] Video Visit from 07/23/2021 in [facility]  Total GAD-7 Score 0 18 6 16    PHQ2-9    Flowsheet Row Video Visit from 09/14/2022 in [facility] Video Visit from 06/23/2022 in [facility] Video Visit from 10/21/2021 in [facility]  Center Video Visit from 07/23/2021 in [facility]  PHQ-2 Total Score 0 4 3 1   PHQ-9 Total Score 0 21 7 5    Flowsheet Row Video Visit from 06/23/2022 in [facility] ED from 11/08/2021 in [facility] Emergency Department at [facility]  C-SSRS RISK CATEGORY Low Risk No Risk   Ismael Franco, MD PGY-3 Psychiatry Resident

## 2024-08-27 ENCOUNTER — Telehealth (HOSPITAL_COMMUNITY): Payer: Self-pay | Admitting: Psychiatry

## 2024-08-27 ENCOUNTER — Encounter (HOSPITAL_COMMUNITY): Admitting: Psychiatry

## 2024-08-27 NOTE — Telephone Encounter (Signed)
 Patient did not show up for the appointment.  Spoke with patient and would like to reschedule appointment for 10/23. Patient reports having enough medications to make it to that appointment.  Michele Franco, MD PGY-3 Psychiatry Resident

## 2024-09-02 NOTE — Progress Notes (Signed)
 BH MD Outpatient Progress Note  09/12/2024 2:32 PM Myleen Brailsford  MRN:  [id number]  Assessment:  Jamiee Milholland presents for follow-up evaluation. In the prior visit,  I discussed starting to taper off Abilify  as she has been doing well and may not need adjunctive therapy with Abilify  and ordered vitamin b12 and folate due to CBC having macrocytic anemia. Patient appears to be doing well since last visit, reporting benefit with her medication and is psychiatrically stable at this time. She denies issues with the decrease in Abilify  from prior visit so will discontinue the medication at this time and discussed that is she notices symptoms of mood destabilization, paranoia, or AVH to call our clinic. I have low concern of psychotic symptoms arising as the main intention of pt being on Abilify  was previously to augment the Zoloft . Abilify  also carries risk of metabolic syndrome so at this time, the risks may be outweighing the possible benefits but will keep monitoring. Also discussed scheduling a lab check due to her CBC results and she may need to be taking a supplement. F/u in 6-8 weeks.  Of note, patient's BP was elevated today. Patient denies symptoms of chest pain, SOB, and blurry vision. Pt was instructed to discuss high BP reading with their PCP.  Identifying Information: Creta Dorame is a 63 y.o. y.o. female with a history of major depressive disorder and generalized anxiety disorder who is an established patient with [facility] for management of anxiety.   Plan:  # MDD  GAD Interventions: -- Continue Zoloft  150 mg daily  -- CBC: macrocytic anemia, ordered vitamin b12 and folate - Discontinue Abilify  5 mg daily  -- Antipsychotic monitoring labs updated on 02/2024. A1c: 5.5 Lipid panel: triglycerides 168   - EKG from 1 year ago, normal sinus rhythm with QTc 437 - AIMS 0, 02/02/2024 - Continue trazodone  150 mg nightly - Continue propranolol  10 mg twice daily  # Pancytopenia  of unknown etiology - Discussed the need for her to follow-up with a primary care physician  Patient was given contact information for behavioral health clinic and was instructed to call 911 for emergencies.   Subjective:   Interval History:  Patient seen alone.  Patient reports feeling good today. Since the previous visit, she notes that life is going well. She states going to work and when she gets off work, she listens to music and watches TV like game shows. She states she goes to her daughter's house 4x a month. She denies any stressors at this time.  Regarding psychiatric symptoms, she denies having sadness and worry at this time. Patient reports the medications are helpful specifically not feeling feeling sad or lonely. She states her children come over every other day. Patient reports the following adverse effects: denies.   Patient reports good sleep, reporting the trazodone  helps her go to sleep. Patient reports good appetite.   Patient denies current SI, HI, and AVH.   Substance use:  Denies tobacco use- quit over one year ago Denies alcohol use Denies marijuana use Denies illicit substance  Visit Diagnosis:    ICD-10-CM   1. MDD (major depressive disorder), recurrent episode, moderate (HCC)  F33.1     2. 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, 4 children Support is her son  Past Medical History:  Past Medical History:  Diagnosis Date   Acute myocardial infarction of other anterior wall, initial episode of care    Anxiety    Arthritis    Ischemic cardiomyopathy    Mixed hyperlipidemia    Tobacco use disorder     Past Surgical History:  Procedure Laterality Date   LEFT HEART CATHETERIZATION WITH CORONARY ANGIOGRAM Bilateral 09/05/2013   Procedure:  LEFT HEART CATHETERIZATION WITH CORONARY ANGIOGRAM;  Surgeon: Candyce GORMAN Reek, MD;  Location: [facility] CATH LAB;  Service: Cardiovascular;  Laterality: Bilateral;   PERCUTANEOUS CORONARY STENT INTERVENTION (PCI-S)  09/05/2013   Procedure: PERCUTANEOUS CORONARY STENT INTERVENTION (PCI-S);  Surgeon: Candyce GORMAN Reek, MD;  Location: [facility] CATH LAB;  Service: Cardiovascular;;  DES to mid LAD    Family History:  Family History  Problem Relation Age of Onset   Throat cancer Mother    Heart attack Neg Hx     Social History   Socioeconomic History   Marital status: Single    Spouse name: Not on file   Number of children: Not on file   Years of education: Not on file   Highest education level: Not on file  Occupational History   Not on file  Tobacco Use   Smoking status: Every Day    Current packs/day: 0.00    Types: Cigarettes    Last attempt to quit: 09/05/2013    Years since quitting: 11.0   Smokeless tobacco: Never  Substance and Sexual Activity   Alcohol use: Yes    Alcohol/week: 1.0 standard drink of alcohol    Types: 1 Shots of liquor per week   Drug use: No   Sexual activity: Not on file  Other Topics Concern   Not on file  Social History Narrative   Not on file   Social Drivers of Health   Financial Resource Strain: Not on file  Food Insecurity: Not on file  Transportation Needs: Not on file  Physical Activity: Not on file  Stress: Not on file  Social Connections: Not on file    Allergies: No Known Allergies  Current Medications: Current Outpatient Medications  Medication Sig Dispense Refill   ARIPiprazole  (ABILIFY ) 5 MG tablet Take 1 tablet (5 mg total) by mouth daily. 30 tablet 1   propranolol  (INDERAL ) 10 MG tablet Take 1 tablet (10 mg total) by mouth 2 (two) times daily. 60 tablet 1   sertraline  (ZOLOFT ) 100 MG tablet Take 1.5 tablets (150 mg total) by mouth daily. 45 tablet 1   traZODone  (DESYREL ) 150 MG tablet Take 1 tablet (150 mg total) by mouth at  bedtime. 30 tablet 1   No current facility-administered medications for this visit.     Objective:  Psychiatric Specialty Exam: General Appearance: appears at stated age, casually dressed and groomed   Behavior: pleasant and cooperative   Psychomotor Activity: no psychomotor agitation or retardation noted   Eye Contact: fair  Speech: normal amount, volume and fluency    Mood: euthymic  Affect: congruent, pleasant and interactive   Thought Process: linear, goal directed, no circumstantial or tangential thought process noted, no racing thoughts or flight of ideas  Descriptions of Associations: intact   Thought Content Hallucinations: denies AH, VH , does not appear responding to stimuli  Delusions: no paranoia, delusions of control, grandeur, ideas of reference, thought broadcasting, and magical thinking  Suicidal Thoughts: denies SI, intention, plan  Homicidal Thoughts: denies HI, intention, plan   Alertness/Orientation: alert and fully oriented  Insight: fair Judgment: fair  Memory: intact   Executive Functions  Concentration: intact  Attention Span: fair  Recall: intact  Fund of Knowledge: fair   Physical Exam  General: Pleasant, well-appearing . No acute distress. Pulmonary: Normal effort. No wheezing or rales. Skin: No obvious rash or lesions. Neuro: A&Ox3.No focal deficit.  Review of Systems  No reported symptoms   Metabolic Disorder Labs: Lab Results  Component Value Date   HGBA1C 5.5 02/26/2024   No results found for: PROLACTIN Lab Results  Component Value Date   CHOL 167 02/26/2024   TRIG 168 (H) 02/26/2024   HDL 40 02/26/2024   CHOLHDL 4.2 02/26/2024   VLDL 14 08/08/2016   LDLCALC 98 02/26/2024   LDLCALC 79 08/08/2016   Lab Results  Component Value Date   TSH 1.480 02/26/2024    Therapeutic Level Labs: No results found for: LITHIUM No results found for: VALPROATE No results found for: CBMZ  Screenings: GAD-7     Flowsheet Row Video Visit from 09/14/2022 in [facility] Video Visit from 06/23/2022 in [facility] Video Visit from 10/21/2021 in [facility] Video Visit from 07/23/2021 in [facility]  Total GAD-7 Score 0 18 6 16    PHQ2-9    Flowsheet Row Video Visit from 09/14/2022 in [facility] Video Visit from 06/23/2022 in [facility] Video Visit from 10/21/2021 in [facility] Video Visit from 07/23/2021 in [facility]  PHQ-2 Total Score 0 4 3 1   PHQ-9 Total Score 0 21 7 5    Flowsheet Row Video Visit from 06/23/2022 in [facility] ED from 11/08/2021 in [facility] Emergency Department at [facility]  C-SSRS RISK CATEGORY Low Risk No Risk   Ismael Franco, MD PGY-3 Psychiatry Resident

## 2024-09-12 ENCOUNTER — Ambulatory Visit (INDEPENDENT_AMBULATORY_CARE_PROVIDER_SITE_OTHER): Admitting: Psychiatry

## 2024-09-12 VITALS — BP 156/75 | Wt 154.0 lb

## 2024-09-12 DIAGNOSIS — F3341 Major depressive disorder, recurrent, in partial remission: Secondary | ICD-10-CM | POA: Diagnosis not present

## 2024-09-12 DIAGNOSIS — F411 Generalized anxiety disorder: Secondary | ICD-10-CM | POA: Insufficient documentation

## 2024-09-12 DIAGNOSIS — F331 Major depressive disorder, recurrent, moderate: Secondary | ICD-10-CM | POA: Insufficient documentation

## 2024-09-12 MED ORDER — PROPRANOLOL HCL 10 MG PO TABS
10.0000 mg | ORAL_TABLET | Freq: Two times a day (BID) | ORAL | 1 refills | Status: AC
Start: 2024-09-12 — End: ?

## 2024-09-12 MED ORDER — SERTRALINE HCL 100 MG PO TABS: 150.0000 mg | ORAL_TABLET | Freq: Every day | ORAL | 1 refills | Status: DC

## 2024-09-12 MED ORDER — TRAZODONE HCL 150 MG PO TABS: 150.0000 mg | ORAL_TABLET | Freq: Every day | ORAL | 1 refills | Status: DC

## 2024-09-12 NOTE — Addendum Note (Signed)
 Addended by: CARVIN CROCK on: 09/12/2024 02:58 PM   Modules accepted: Level of Service

## 2024-09-16 ENCOUNTER — Encounter (HOSPITAL_COMMUNITY): Payer: Self-pay

## 2024-09-16 ENCOUNTER — Other Ambulatory Visit (HOSPITAL_COMMUNITY)

## 2024-10-02 DIAGNOSIS — Z419 Encounter for procedure for purposes other than remedying health state, unspecified: Secondary | ICD-10-CM | POA: Diagnosis not present

## 2024-10-28 NOTE — Progress Notes (Addendum)
 BH MD Outpatient Progress Note  11/07/2024 2:47 PM Michele Black  MRN:  [id number]  Assessment:  Michele Black presents for follow-up evaluation. In the prior visit,  we discontinued Abilify .  Today, patient reports increased sadness without new psychosocial stress; however, denies any neurovegetative symptoms. Pt asks to restart Abilify  however shared decision making was completed and will increase Zoloft  to 200 mg to maximize possible effect and in the next visit if symptoms still persist then we can discuss restarting the Abilify  and this is to avoid potential AE of Abilify . F/u in 6 weeks.   Identifying Information: Michele Black is a 63 y.o. y.o. female with a history of major depressive disorder and generalized anxiety disorder who is an established patient with [facility] for management of anxiety.   Plan:  # MDD  GAD Interventions: -- Increase Zoloft  to 200 mg daily  -- CBC: macrocytic anemia, ordered vitamin b12 and folate - Continue trazodone  150 mg nightly - Continue propranolol  10 mg twice daily PRN   # Pancytopenia of unknown etiology - Discussed the need for her to follow-up with a primary care physician  Patient was given contact information for behavioral health clinic and was instructed to call 911 for emergencies.   Subjective:   Interval History:  Patient seen alone.  Patient reports feeling okay today. Since the previous visit, she notes the holidays was good. She notes watching Tv on her free time. She denies anything stressing her out.   Regarding psychiatric symptoms, she feels that she needs to be back on the Abilify , stating she feels more sluggish and she does not feel like herself, stating she is not as happy and denies triggers. She notes taking propranolol  twice a day after anxious. Otherwise she notes doing well. I discussed the option of first optimizing her Zoloft  dose before adding back Abilify  and she is agreeable to giving this a  try.   Patient reports good sleep. Patient reports good appetite.   Patient denies current SI, HI, and AVH.   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, 4 children Support is her son  Past Medical History:  Past Medical History:  Diagnosis Date   Acute myocardial infarction of other anterior wall, initial episode of care    Anxiety    Arthritis    Ischemic cardiomyopathy    Mixed hyperlipidemia    Tobacco use disorder     Past Surgical History:  Procedure Laterality Date   LEFT HEART CATHETERIZATION WITH CORONARY ANGIOGRAM Bilateral 09/05/2013   Procedure: LEFT HEART CATHETERIZATION WITH CORONARY ANGIOGRAM;  Surgeon: Candyce GORMAN Reek, MD;  Location: [facility] CATH LAB;  Service: Cardiovascular;  Laterality: Bilateral;   PERCUTANEOUS CORONARY STENT INTERVENTION (PCI-S)  09/05/2013   Procedure: PERCUTANEOUS CORONARY STENT INTERVENTION (PCI-S);  Surgeon: Candyce GORMAN Reek, MD;  Location: [facility] CATH LAB;  Service: Cardiovascular;;  DES to mid LAD    Family History:  Family History  Problem Relation Age of Onset   Throat cancer Mother    Heart attack Neg Hx     Social History   Socioeconomic History   Marital status: Single    Spouse name: Not on file   Number of children: Not on file   Years of education:  Not on file   Highest education level: Not on file  Occupational History   Not on file  Tobacco Use   Smoking status: Every Day    Current packs/day: 0.00    Average packs/day: 1.0 packs/day    Types: Cigarettes    Last attempt to quit: 09/05/2013    Years since quitting: 11.1   Smokeless tobacco: Never  Substance and Sexual Activity   Alcohol use: Yes    Alcohol/week: 1.0 standard  drink of alcohol    Types: 1 Shots of liquor per week   Drug use: No   Sexual activity: Not on file  Other Topics Concern   Not on file  Social History Narrative   Not on file   Social Drivers of Health   Tobacco Use: High Risk (06/30/2023)   Patient History    Smoking Tobacco Use: Every Day    Smokeless Tobacco Use: Never    Passive Exposure: Not on file  Financial Resource Strain: Not on file  Food Insecurity: Not on file  Transportation Needs: Not on file  Physical Activity: Not on file  Stress: Not on file  Social Connections: Not on file  Depression (PHQ2-9): Low Risk (09/14/2022)   Depression (PHQ2-9)    PHQ-2 Score: 0  Recent Concern: Depression (PHQ2-9) - High Risk (06/23/2022)   Depression (PHQ2-9)    PHQ-2 Score: 21  Alcohol Screen: Not on file  Housing: Not on file  Utilities: Not on file  Health Literacy: Not on file    Allergies: No Known Allergies  Current Medications: Current Outpatient Medications  Medication Sig Dispense Refill   propranolol  (INDERAL ) 10 MG tablet Take 1 tablet (10 mg total) by mouth 2 (two) times daily. 60 tablet 1   sertraline  (ZOLOFT ) 100 MG tablet Take 2 tablets (200 mg total) by mouth daily. 60 tablet 1   traZODone  (DESYREL ) 150 MG tablet Take 1 tablet (150 mg total) by mouth at bedtime. 30 tablet 1   No current facility-administered medications for this visit.     Objective:  Psychiatric Specialty Exam: General Appearance: appears at stated age, casually dressed and groomed   Behavior: pleasant and cooperative   Psychomotor Activity: no psychomotor agitation or retardation noted   Eye Contact: fair  Speech: normal amount, volume and fluency    Mood: euthymic  Affect: congruent, pleasant and interactive   Thought Process: linear, goal directed, no circumstantial or tangential thought process noted, no racing thoughts or flight of ideas  Descriptions of Associations: intact   Thought Content Hallucinations: denies  AH, VH , does not appear responding to stimuli  Delusions: no paranoia, delusions of control, grandeur, ideas of reference, thought broadcasting, and magical thinking  Suicidal Thoughts: denies SI, intention, plan  Homicidal Thoughts: denies HI, intention, plan   Alertness/Orientation: alert and fully oriented   Insight: fair Judgment: fair  Memory: intact   Executive Functions  Concentration: intact  Attention Span: fair  Recall: intact  Fund of Knowledge: fair   Physical Exam  General: Pleasant, well-appearing. No acute distress. Pulmonary: Normal effort. No wheezing or rales. Skin: No obvious rash or lesions. Neuro: A&Ox3.No focal deficit.  Review of Systems  No reported symptoms  Metabolic Disorder Labs: Lab Results  Component Value Date   HGBA1C 5.5 02/26/2024   No results found for: PROLACTIN Lab Results  Component Value Date   CHOL 167 02/26/2024   TRIG 168 (H) 02/26/2024   HDL 40 02/26/2024   CHOLHDL 4.2 02/26/2024   VLDL  14 08/08/2016   LDLCALC 98 02/26/2024   LDLCALC 79 08/08/2016   Lab Results  Component Value Date   TSH 1.480 02/26/2024    Therapeutic Level Labs: No results found for: LITHIUM No results found for: VALPROATE No results found for: CBMZ  Screenings: GAD-7    Flowsheet Row Video Visit from 09/14/2022 in [facility] Video Visit from 06/23/2022 in [facility] Video Visit from 10/21/2021 in [facility]  Total GAD-7 Score 0 18 6 16    PHQ2-9    Flowsheet Row Video Visit from 09/14/2022 in [facility] Video Visit from 06/23/2022 in [facility] Video Visit from 10/21/2021 in [facility] Video Visit from 07/23/2021 in [facility]  PHQ-2 Total Score 0 4 3 1   PHQ-9 Total  Score 0 21 7 5    Flowsheet Row Video Visit from 06/23/2022 in [facility] ED from 11/08/2021 in [facility] Emergency Department at [facility]  C-SSRS RISK CATEGORY Low Risk No Risk   Ismael Franco, MD PGY-3 Psychiatry Resident

## 2024-11-07 ENCOUNTER — Ambulatory Visit (HOSPITAL_COMMUNITY): Admitting: Psychiatry

## 2024-11-07 VITALS — BP 145/83 | Wt 146.0 lb

## 2024-11-07 DIAGNOSIS — F331 Major depressive disorder, recurrent, moderate: Secondary | ICD-10-CM | POA: Diagnosis not present

## 2024-11-07 DIAGNOSIS — F411 Generalized anxiety disorder: Secondary | ICD-10-CM | POA: Diagnosis not present

## 2024-11-07 DIAGNOSIS — F3341 Major depressive disorder, recurrent, in partial remission: Secondary | ICD-10-CM

## 2024-11-07 MED ORDER — TRAZODONE HCL 150 MG PO TABS: 150.0000 mg | ORAL_TABLET | Freq: Every day | ORAL | 1 refills | Status: DC

## 2024-11-07 MED ORDER — SERTRALINE HCL 100 MG PO TABS: 200.0000 mg | ORAL_TABLET | Freq: Every day | ORAL | 1 refills | Status: DC

## 2024-11-07 MED ORDER — PROPRANOLOL HCL 10 MG PO TABS: 10.0000 mg | ORAL_TABLET | Freq: Two times a day (BID) | ORAL | 1 refills | Status: DC

## 2024-12-09 NOTE — Progress Notes (Signed)
 BH MD Outpatient Progress Note  12/19/2024 1:50 PM [name]  MRN:  [id number]  Assessment:  Michele Black presents for follow-up evaluation. In the prior visit,  we increased Zoloft  to aid with ongoing depression and low energy.   Today, patient presents with relapse in her anxiety symptoms in the context of a new psychosocial stressor of her son moving. The anxiety appears to be specific to fears about her family and she has insight that the fears seem to be irrational. She has minimal coping strategies to aid with her anxiety nor has she ever seen a therapist in which I feel this would greatly benefit her to aid with cognitive reframing and grounding techniques. She is interested in starting therapy so I placed a therapy referral. We also completed a deep breathing exercise during today's appt.  I also discussed her most recent CBC and concern for macrocytic anemia and the recommendation to check her vitamin b12 and folate as low levels can affect cognition and she plans to obtaining labs in the coming month. Regarding medications, I discussed that we will increase the frequency of her propranolol  from BID to TID. In the following appointment, will reassess depressive and anxiety symptoms and f/u on lab work.   Identifying Information: Michele Black is a 64 y.o. y.o. female with a history of major depressive disorder and generalized anxiety disorder who is an established patient with [facility] for management of anxiety.   Plan:  # MDD # GAD -- Continue Zoloft  200 mg daily  -- CBC: macrocytic anemia, ordered vitamin b12 and folate - Continue trazodone  150 mg nightly - Increase propranolol  10 mg from twice daily PRN to TID PRN  # Pancytopenia of unknown etiology - Discussed the need for her to follow-up with a primary care physician  Patient was given contact information for behavioral health clinic and was instructed to call 911 for emergencies.    Subjective:   Interval History:   Patient reports feeling depressed today about her stressors. Since the previous visit, she states her son moved to [place]  3 weeks ago whom she is close with. She states that she worries mainly about her children who are grown. She likes I think about crazy things like them getting in a car accident. She denies having a previous traumatic experience with her children causing her to worry. She states the anxiety has worsened since last visit, stating this is occurring daily for the past month. She notes crying 3-4 times a day due to the anxiety. She states taking propranolol  twice a day for for anxiety and she does not notice much of an effect. She is amenable to starting therapy in our clinic. Discussed deep breathing technique to aid with relaxation.  Patient reports good sleep attributing this to trazodone . Patient reports poor appetite stating this is a new issue since 3 weeks ago. She notes good energy.   Patient denies current SI, HI, and AVH.   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, 4 children Support is her son  Past Medical History:  Past Medical History:  Diagnosis Date   Acute myocardial infarction of other anterior wall, initial episode of care    Anxiety    Arthritis  Ischemic cardiomyopathy    Mixed hyperlipidemia    Tobacco use disorder     Past Surgical History:  Procedure Laterality Date   LEFT HEART CATHETERIZATION WITH CORONARY ANGIOGRAM Bilateral 09/05/2013   Procedure: LEFT HEART CATHETERIZATION WITH CORONARY ANGIOGRAM;  Surgeon: Candyce GORMAN Reek, MD;  Location: [facility] CATH LAB;  Service: Cardiovascular;  Laterality: Bilateral;    PERCUTANEOUS CORONARY STENT INTERVENTION (PCI-S)  09/05/2013   Procedure: PERCUTANEOUS CORONARY STENT INTERVENTION (PCI-S);  Surgeon: Candyce GORMAN Reek, MD;  Location: [facility] CATH LAB;  Service: Cardiovascular;;  DES to mid LAD    Family History:  Family History  Problem Relation Age of Onset   Throat cancer Mother    Heart attack Neg Hx     Social History   Socioeconomic History   Marital status: Single    Spouse name: Not on file   Number of children: Not on file   Years of education: Not on file   Highest education level: Not on file  Occupational History   Not on file  Tobacco Use   Smoking status: Every Day    Current packs/day: 0.00    Average packs/day: 1.0 packs/day    Types: Cigarettes    Last attempt to quit: 09/05/2013    Years since quitting: 11.2   Smokeless tobacco: Never  Substance and Sexual Activity   Alcohol use: Yes    Alcohol/week: 1.0 standard drink of alcohol    Types: 1 Shots of liquor per week   Drug use: No   Sexual activity: Not on file  Other Topics Concern   Not on file  Social History Narrative   Not on file   Social Drivers of Health   Tobacco Use: High Risk (06/30/2023)   Patient History    Smoking Tobacco Use: Every Day    Smokeless Tobacco Use: Never    Passive Exposure: Not on file  Financial Resource Strain: Not on file  Food Insecurity: Not on file  Transportation Needs: Not on file  Physical Activity: Not on file  Stress: Not on file  Social Connections: Not on file  Depression (PHQ2-9): Low Risk (09/14/2022)   Depression (PHQ2-9)    PHQ-2 Score: 0  Recent Concern: Depression (PHQ2-9) - High Risk (06/23/2022)   Depression (PHQ2-9)    PHQ-2 Score: 21  Alcohol Screen: Not on file  Housing: Not on file  Utilities: Not on file  Health Literacy: Not on file    Allergies: No Known Allergies  Current Medications: Current Outpatient Medications  Medication Sig Dispense Refill   propranolol  (INDERAL ) 10 MG tablet Take 1  tablet (10 mg total) by mouth 2 (two) times daily. 60 tablet 1   sertraline  (ZOLOFT ) 100 MG tablet Take 2 tablets (200 mg total) by mouth daily. 60 tablet 1   traZODone  (DESYREL ) 150 MG tablet Take 1 tablet (150 mg total) by mouth at bedtime. 30 tablet 1   No current facility-administered medications for this visit.     Objective:  Psychiatric Specialty Exam: General Appearance: appears at stated age, casually dressed and groomed   Behavior: pleasant and cooperative   Psychomotor Activity: no psychomotor agitation or retardation noted   Eye Contact: fair  Speech: normal amount, volume and fluency    Mood: anxious  Affect: congruent  Thought Process: linear, goal directed, no circumstantial or tangential thought process noted, no racing thoughts or flight of ideas  Descriptions of Associations: intact   Thought Content Hallucinations: denies AH, VH , does not appear responding to  stimuli  Delusions: no paranoia, delusions of control, grandeur, ideas of reference, thought broadcasting, and magical thinking  Suicidal Thoughts: denies SI, intention, plan  Homicidal Thoughts: denies HI, intention, plan   Alertness/Orientation: alert and fully oriented   Insight: fair Judgment: fair  Memory: intact   Executive Functions  Concentration: intact  Attention Span: fair  Recall: intact  Fund of Knowledge: fair   Physical Exam  General: Pleasant, well-appearing. No acute distress. Pulmonary: Normal effort. No wheezing or rales. Skin: No obvious rash or lesions. Neuro: A&Ox3.No focal deficit.  Review of Systems  No reported symptoms  Metabolic Disorder Labs: Lab Results  Component Value Date   HGBA1C 5.5 02/26/2024   No results found for: PROLACTIN Lab Results  Component Value Date   CHOL 167 02/26/2024   TRIG 168 (H) 02/26/2024   HDL 40 02/26/2024   CHOLHDL 4.2 02/26/2024   VLDL 14 08/08/2016   LDLCALC 98 02/26/2024   LDLCALC 79 08/08/2016   Lab Results   Component Value Date   TSH 1.480 02/26/2024    Therapeutic Level Labs: No results found for: LITHIUM No results found for: VALPROATE No results found for: CBMZ  Screenings: GAD-7    Flowsheet Row Video Visit from 09/14/2022 in [facility] Video Visit from 06/23/2022 in [facility] Video Visit from 10/21/2021 in [facility] Video Visit from 07/23/2021 in [facility]  Total GAD-7 Score 0 18 6 16    PHQ2-9    Flowsheet Row Video Visit from 09/14/2022 in [facility] Video Visit from 06/23/2022 in [facility] Video Visit from 10/21/2021 in [facility] Video Visit from 07/23/2021 in [facility]  PHQ-2 Total Score 0 4 3 1   PHQ-9 Total Score 0 21 7 5    Flowsheet Row Video Visit from 06/23/2022 in [facility] ED from 11/08/2021 in [facility] Department [facility]  C-SSRS RISK CATEGORY Low Risk No Risk   Ismael Franco, MD PGY-3 Psychiatry Resident

## 2024-12-19 ENCOUNTER — Ambulatory Visit (HOSPITAL_COMMUNITY): Admitting: Psychiatry

## 2024-12-19 VITALS — BP 147/81

## 2024-12-19 DIAGNOSIS — F411 Generalized anxiety disorder: Secondary | ICD-10-CM

## 2024-12-19 DIAGNOSIS — F331 Major depressive disorder, recurrent, moderate: Secondary | ICD-10-CM

## 2024-12-19 DIAGNOSIS — F3341 Major depressive disorder, recurrent, in partial remission: Secondary | ICD-10-CM

## 2024-12-19 MED ORDER — TRAZODONE HCL 150 MG PO TABS: 150.0000 mg | ORAL_TABLET | Freq: Every day | ORAL | 0 refills | Status: AC

## 2024-12-19 MED ORDER — PROPRANOLOL HCL 10 MG PO TABS: 10.0000 mg | ORAL_TABLET | Freq: Three times a day (TID) | ORAL | 1 refills | Status: AC | PRN

## 2024-12-19 MED ORDER — SERTRALINE HCL 100 MG PO TABS: 200.0000 mg | ORAL_TABLET | Freq: Every day | ORAL | 2 refills | Status: AC

## 2025-01-06 ENCOUNTER — Other Ambulatory Visit (HOSPITAL_COMMUNITY)

## 2025-01-23 ENCOUNTER — Ambulatory Visit (HOSPITAL_COMMUNITY)

## 2025-02-27 ENCOUNTER — Encounter (HOSPITAL_COMMUNITY): Admitting: Psychiatry

## 2025-03-06 ENCOUNTER — Encounter (HOSPITAL_COMMUNITY): Admitting: Psychiatry
